# Patient Record
Sex: Female | Born: 1965
Health system: Southern US, Community
[De-identification: ages and names within clinical notes are randomized; demographics above are authoritative.]

## PROBLEM LIST (undated history)

## (undated) DIAGNOSIS — F419 Anxiety disorder, unspecified: Secondary | ICD-10-CM

## (undated) DIAGNOSIS — E785 Hyperlipidemia, unspecified: Secondary | ICD-10-CM

## (undated) DIAGNOSIS — G8929 Other chronic pain: Secondary | ICD-10-CM

## (undated) DIAGNOSIS — M069 Rheumatoid arthritis, unspecified: Secondary | ICD-10-CM

## (undated) DIAGNOSIS — R6884 Jaw pain: Secondary | ICD-10-CM

## (undated) DIAGNOSIS — F4310 Post-traumatic stress disorder, unspecified: Secondary | ICD-10-CM

## (undated) DIAGNOSIS — T7840XA Allergy, unspecified, initial encounter: Secondary | ICD-10-CM

## (undated) DIAGNOSIS — N3010 Interstitial cystitis (chronic) without hematuria: Secondary | ICD-10-CM

## (undated) DIAGNOSIS — Z5189 Encounter for other specified aftercare: Secondary | ICD-10-CM

## (undated) DIAGNOSIS — K589 Irritable bowel syndrome without diarrhea: Secondary | ICD-10-CM

## (undated) DIAGNOSIS — E079 Disorder of thyroid, unspecified: Secondary | ICD-10-CM

## (undated) DIAGNOSIS — F32A Depression, unspecified: Secondary | ICD-10-CM

## (undated) DIAGNOSIS — K219 Gastro-esophageal reflux disease without esophagitis: Secondary | ICD-10-CM

## (undated) DIAGNOSIS — M797 Fibromyalgia: Secondary | ICD-10-CM

## (undated) DIAGNOSIS — G4730 Sleep apnea, unspecified: Secondary | ICD-10-CM

## (undated) DIAGNOSIS — R87629 Unspecified abnormal cytological findings in specimens from vagina: Secondary | ICD-10-CM

## (undated) DIAGNOSIS — F329 Major depressive disorder, single episode, unspecified: Secondary | ICD-10-CM

## (undated) DIAGNOSIS — R32 Unspecified urinary incontinence: Secondary | ICD-10-CM

## (undated) HISTORY — DX: Gastro-esophageal reflux disease without esophagitis: K21.9

## (undated) HISTORY — DX: Post-traumatic stress disorder, unspecified: F43.10

## (undated) HISTORY — DX: Unspecified abnormal cytological findings in specimens from vagina: R87.629

## (undated) HISTORY — DX: Anxiety disorder, unspecified: F41.9

## (undated) HISTORY — PX: BLADDER SURGERY: SHX569

## (undated) HISTORY — DX: Major depressive disorder, single episode, unspecified: F32.9

## (undated) HISTORY — DX: Depression, unspecified: F32.A

## (undated) HISTORY — DX: Allergy, unspecified, initial encounter: T78.40XA

## (undated) HISTORY — DX: Other chronic pain: G89.29

## (undated) HISTORY — DX: Rheumatoid arthritis, unspecified: M06.9

## (undated) HISTORY — DX: Encounter for other specified aftercare: Z51.89

## (undated) HISTORY — DX: Disorder of thyroid, unspecified: E07.9

## (undated) HISTORY — DX: Jaw pain: R68.84

## (undated) HISTORY — DX: Interstitial cystitis (chronic) without hematuria: N30.10

## (undated) HISTORY — DX: Irritable bowel syndrome without diarrhea: K58.9

## (undated) HISTORY — DX: Hyperlipidemia, unspecified: E78.5

## (undated) HISTORY — DX: Fibromyalgia: M79.7

## (undated) HISTORY — PX: AUGMENTATION MAMMAPLASTY: SUR837

## (undated) HISTORY — DX: Unspecified urinary incontinence: R32

## (undated) HISTORY — PX: CYSTOSCOPY: SUR368

## (undated) HISTORY — PX: REDUCTION MAMMAPLASTY: SUR839

## (undated) HISTORY — PX: URETHRAL DILATION: SUR417

## (undated) HISTORY — PX: LEEP: SHX91

## (undated) HISTORY — DX: Sleep apnea, unspecified: G47.30

## (undated) HISTORY — PX: COSMETIC SURGERY: SHX468

---

## 2016-05-06 ENCOUNTER — Ambulatory Visit (INDEPENDENT_AMBULATORY_CARE_PROVIDER_SITE_OTHER): Payer: 59 | Admitting: Family Medicine

## 2016-05-06 ENCOUNTER — Encounter: Payer: Self-pay | Admitting: Family Medicine

## 2016-05-06 VITALS — BP 140/89 | HR 83 | Ht 63.0 in | Wt 156.0 lb

## 2016-05-06 DIAGNOSIS — M797 Fibromyalgia: Secondary | ICD-10-CM | POA: Diagnosis not present

## 2016-05-06 DIAGNOSIS — K589 Irritable bowel syndrome without diarrhea: Secondary | ICD-10-CM | POA: Diagnosis not present

## 2016-05-06 DIAGNOSIS — N3010 Interstitial cystitis (chronic) without hematuria: Secondary | ICD-10-CM | POA: Diagnosis not present

## 2016-05-06 DIAGNOSIS — K219 Gastro-esophageal reflux disease without esophagitis: Secondary | ICD-10-CM | POA: Insufficient documentation

## 2016-05-06 DIAGNOSIS — M2550 Pain in unspecified joint: Secondary | ICD-10-CM

## 2016-05-06 HISTORY — DX: Irritable bowel syndrome, unspecified: K58.9

## 2016-05-06 HISTORY — DX: Interstitial cystitis (chronic) without hematuria: N30.10

## 2016-05-06 NOTE — Patient Instructions (Signed)
Please contact me by the middle of next week if not contacted by a new pain management clinic here in [place].

## 2016-05-06 NOTE — Addendum Note (Signed)
Addended by: Wyline Beady on: 05/06/2016 04:08 PM   Modules accepted: Medications

## 2016-05-06 NOTE — Progress Notes (Signed)
CC: Debbie Hancock is a 50 y.o. female is here for Establish Care   Subjective: HPI:  Pleasant 50 year old here to establish care  She's considering moving here from [place]. She wants know if I can help arrange a referral to a pain management clinic. Her past medical history significant for a diagnosis of interstitial cystitis that apparently her urologist and gynecologist back in [place] tried their best to treat but she failed all known medical and surgical procedures to try to alleviate the pain from her pelvis. She tells me she experiences a constant burning sensation localized to the bladder in that she takes hydrocodone products. She is looking for pain management clinic that would help manage medications like this. She also has been experiencing diffuse arthritis mostly localized to the hands and to the hips. She's tried a variety of nonsteroidal anti-inflammatories but they've been ineffective. She denies any swelling redness or warmth of any of these joints. She also tells me she suffers from fibromyalgia that has been treated somewhat successfully with gabapentin.  Review of Systems - General ROS: negative for - chills, fever, night sweats, weight gain or weight loss Ophthalmic ROS: negative for - decreased vision Psychological ROS: negative for - anxiety  ENT ROS: negative for - hearing change, nasal congestion, tinnitus or allergies Hematological and Lymphatic ROS: negative for - bleeding problems, bruising or swollen lymph nodes Breast ROS: negative Respiratory ROS: no cough, shortness of breath, or wheezing Cardiovascular ROS: no chest pain or dyspnea on exertion Gastrointestinal ROS: no abdominal pain, change in bowel habits, or black or bloody stools Genito-Urinary ROS: negative for - genital discharge, genital ulcers, or abnormal bleeding from genitals. Positive for incontinence following urethral dilation Musculoskeletal ROS: negative for - joint pain or muscle pain  other than that described above Neurological ROS: negative for - headaches or memory loss Dermatological ROS: negative for lumps, mole changes, rash and skin lesion changes  Past Medical History  Diagnosis Date  . Interstitial cystitis 05/06/2016  . IBS (irritable bowel syndrome) 05/06/2016    No past surgical history on file. No family history on file.  Social History   Social History  . Marital Status: N/A    Spouse Name: N/A  . Number of Children: N/A  . Years of Education: N/A   Occupational History  . Not on file.   Social History Main Topics  . Smoking status: Never Smoker   . Smokeless tobacco: Not on file  . Alcohol Use: Not on file  . Drug Use: Not on file  . Sexual Activity: Not on file   Other Topics Concern  . Not on file   Social History Narrative  . No narrative on file     Objective: BP 140/89 mmHg  Pulse 83  Ht 5\' 3"  (1.6 m)  Wt 156 lb (70.761 kg)  BMI 27.64 kg/m2  Vital signs reviewed. General: Alert and Oriented, No Acute Distress HEENT: Pupils equal, round, reactive to light. Conjunctivae clear.  External ears unremarkable.  Moist mucous membranes. Lungs: Clear and comfortable work of breathing, speaking in full sentences without accessory muscle use. Cardiac: Regular rate and rhythm.  Neuro: CN II-XII grossly intact, gait normal. Extremities: No peripheral edema.  Strong peripheral pulses.  Mental Status: No depression, anxiety, nor agitation. Logical though process. Skin: Warm and dry.  Assessment & Plan: Debbie Hancock was seen today for establish care.  Diagnoses and all orders for this visit:  Interstitial cystitis -     Ambulatory referral to  Pain Clinic  Fibromyalgia  IBS (irritable bowel syndrome)  Multiple joint pain -     Ambulatory referral to Pain Clinic   Her primary goal today was a referral to pain management which seems reasonable. Joint decision to keep her appointment in July with her pre-existing pain management clinic  in [place] which she will cancel if she get a sooner appointment here in July.  Return if symptoms worsen or fail to improve.

## 2016-09-15 ENCOUNTER — Encounter: Payer: Self-pay | Admitting: Physician Assistant

## 2016-09-21 ENCOUNTER — Encounter: Payer: Self-pay | Admitting: Physician Assistant

## 2016-10-14 DIAGNOSIS — G8929 Other chronic pain: Secondary | ICD-10-CM | POA: Insufficient documentation

## 2016-10-14 DIAGNOSIS — M2550 Pain in unspecified joint: Secondary | ICD-10-CM | POA: Insufficient documentation

## 2018-07-15 ENCOUNTER — Encounter: Payer: Self-pay | Admitting: Physician Assistant

## 2018-07-15 ENCOUNTER — Ambulatory Visit (INDEPENDENT_AMBULATORY_CARE_PROVIDER_SITE_OTHER): Payer: BLUE CROSS/BLUE SHIELD | Admitting: Physician Assistant

## 2018-07-15 ENCOUNTER — Telehealth: Payer: Self-pay | Admitting: Physician Assistant

## 2018-07-15 VITALS — BP 101/65 | HR 86 | Ht 63.0 in | Wt 122.0 lb

## 2018-07-15 DIAGNOSIS — N3010 Interstitial cystitis (chronic) without hematuria: Secondary | ICD-10-CM

## 2018-07-15 DIAGNOSIS — F332 Major depressive disorder, recurrent severe without psychotic features: Secondary | ICD-10-CM

## 2018-07-15 DIAGNOSIS — F411 Generalized anxiety disorder: Secondary | ICD-10-CM

## 2018-07-15 DIAGNOSIS — F3181 Bipolar II disorder: Secondary | ICD-10-CM

## 2018-07-15 DIAGNOSIS — F430 Acute stress reaction: Secondary | ICD-10-CM | POA: Diagnosis not present

## 2018-07-15 DIAGNOSIS — R300 Dysuria: Secondary | ICD-10-CM

## 2018-07-15 DIAGNOSIS — Z6981 Encounter for mental health services for victim of other abuse: Secondary | ICD-10-CM

## 2018-07-15 DIAGNOSIS — M797 Fibromyalgia: Secondary | ICD-10-CM

## 2018-07-15 DIAGNOSIS — M329 Systemic lupus erythematosus, unspecified: Secondary | ICD-10-CM

## 2018-07-15 DIAGNOSIS — E782 Mixed hyperlipidemia: Secondary | ICD-10-CM

## 2018-07-15 DIAGNOSIS — G894 Chronic pain syndrome: Secondary | ICD-10-CM

## 2018-07-15 DIAGNOSIS — Z113 Encounter for screening for infections with a predominantly sexual mode of transmission: Secondary | ICD-10-CM | POA: Diagnosis not present

## 2018-07-15 DIAGNOSIS — E079 Disorder of thyroid, unspecified: Secondary | ICD-10-CM

## 2018-07-15 DIAGNOSIS — M069 Rheumatoid arthritis, unspecified: Secondary | ICD-10-CM

## 2018-07-15 LAB — POCT URINALYSIS DIPSTICK
BILIRUBIN UA: NEGATIVE
Glucose, UA: NEGATIVE
Ketones, UA: NEGATIVE
NITRITE UA: NEGATIVE
PH UA: 6.5 (ref 5.0–8.0)
PROTEIN UA: NEGATIVE
RBC UA: NEGATIVE
Spec Grav, UA: 1.015 (ref 1.010–1.025)
UROBILINOGEN UA: 0.2 U/dL

## 2018-07-15 MED ORDER — HYDROXYZINE HCL 50 MG PO TABS: 50.0000 mg | ORAL_TABLET | Freq: Three times a day (TID) | ORAL | 2 refills | Status: DC | PRN

## 2018-07-15 MED ORDER — HYDROCODONE-ACETAMINOPHEN 10-325 MG PO TABS: 1.0000 | ORAL_TABLET | Freq: Two times a day (BID) | ORAL | 0 refills | Status: DC | PRN

## 2018-07-15 MED ORDER — DULOXETINE HCL 30 MG PO CPEP: 30.0000 mg | ORAL_CAPSULE | Freq: Every day | ORAL | 2 refills | Status: DC

## 2018-07-15 MED ORDER — OXYBUTYNIN 3.9 MG/24HR TD PTTW: 1.0000 | MEDICATED_PATCH | TRANSDERMAL | 5 refills | Status: DC

## 2018-07-15 MED ORDER — LISINOPRIL 10 MG PO TABS: 10.0000 mg | ORAL_TABLET | Freq: Every day | ORAL | 3 refills | Status: DC
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
## 2018-07-15 NOTE — Telephone Encounter (Addendum)
Pt called. She wants her scripts called in to the [facility] on [address] She wants a call back to let her know when this is done. Pt called back, she said you forgot to add Lisinopril.

## 2018-07-15 NOTE — Telephone Encounter (Signed)
She needs a refill on Lisinopril 10 mg.

## 2018-07-15 NOTE — Progress Notes (Signed)
Needs culture

## 2018-07-15 NOTE — Telephone Encounter (Signed)
Done

## 2018-07-16 NOTE — Progress Notes (Signed)
Call pt: STD testing negative.

## 2018-07-18 NOTE — Telephone Encounter (Signed)
Left pt VM advising med was called in.

## 2018-07-20 ENCOUNTER — Other Ambulatory Visit: Payer: Self-pay

## 2018-07-20 ENCOUNTER — Other Ambulatory Visit: Payer: Self-pay | Admitting: Physician Assistant

## 2018-07-20 DIAGNOSIS — N3010 Interstitial cystitis (chronic) without hematuria: Secondary | ICD-10-CM

## 2018-07-20 DIAGNOSIS — R300 Dysuria: Secondary | ICD-10-CM

## 2018-07-20 LAB — PAIN MGMT, PROFILE 6 CONF W/O MM, U
6 ACETYLMORPHINE: NEGATIVE ng/mL (ref <10)
Alcohol Metabolites: NEGATIVE ng/mL (ref <500)
Alphahydroxyalprazolam: 158 ng/mL — ABNORMAL HIGH (ref <25)
Alphahydroxymidazolam: NEGATIVE ng/mL (ref <50)
Alphahydroxytriazolam: NEGATIVE ng/mL (ref <50)
Aminoclonazepam: NEGATIVE ng/mL (ref <25)
Amphetamines: NEGATIVE ng/mL (ref <500)
Barbiturates: NEGATIVE ng/mL (ref <300)
Benzodiazepines: POSITIVE ng/mL — AB (ref <100)
CREATININE: 35 mg/dL
Cocaine Metabolite: NEGATIVE ng/mL (ref <150)
Codeine: NEGATIVE ng/mL (ref <50)
HYDROCODONE: NEGATIVE ng/mL (ref <50)
HYDROXYETHYLFLURAZEPAM: NEGATIVE ng/mL (ref <50)
Hydromorphone: NEGATIVE ng/mL (ref <50)
Lorazepam: NEGATIVE ng/mL (ref <50)
METHADONE METABOLITE: NEGATIVE ng/mL (ref <100)
Marijuana Metabolite: NEGATIVE ng/mL (ref <20)
Morphine: 17875 ng/mL — ABNORMAL HIGH (ref <50)
Nordiazepam: NEGATIVE ng/mL (ref <50)
Norhydrocodone: NEGATIVE ng/mL (ref <50)
OXIDANT: NEGATIVE ug/mL (ref <200)
OXYCODONE: NEGATIVE ng/mL (ref <100)
Opiates: POSITIVE ng/mL — AB (ref <100)
Oxazepam: NEGATIVE ng/mL (ref <50)
PH: 6.65 (ref 4.5–9.0)
PHENCYCLIDINE: NEGATIVE ng/mL (ref <25)
TEMAZEPAM: NEGATIVE ng/mL (ref <50)

## 2018-07-20 LAB — C. TRACHOMATIS/N. GONORRHOEAE RNA
C. TRACHOMATIS RNA, TMA: NOT DETECTED
N. gonorrhoeae RNA, TMA: NOT DETECTED

## 2018-07-20 NOTE — Progress Notes (Signed)
Pt has IC and trying to determine if could have UTI or just IC flare. Send order for culture to lab if pt is still symptomatic so we can confirm no infection.

## 2018-07-21 LAB — URINE CULTURE
MICRO NUMBER:: 90936534
SPECIMEN QUALITY:: ADEQUATE

## 2018-07-22 DIAGNOSIS — F411 Generalized anxiety disorder: Secondary | ICD-10-CM | POA: Insufficient documentation

## 2018-07-22 DIAGNOSIS — F430 Acute stress reaction: Secondary | ICD-10-CM | POA: Insufficient documentation

## 2018-07-22 DIAGNOSIS — F3181 Bipolar II disorder: Secondary | ICD-10-CM | POA: Insufficient documentation

## 2018-07-22 DIAGNOSIS — M329 Systemic lupus erythematosus, unspecified: Secondary | ICD-10-CM | POA: Insufficient documentation

## 2018-07-22 DIAGNOSIS — E079 Disorder of thyroid, unspecified: Secondary | ICD-10-CM | POA: Insufficient documentation

## 2018-07-22 DIAGNOSIS — E785 Hyperlipidemia, unspecified: Secondary | ICD-10-CM | POA: Insufficient documentation

## 2018-07-22 DIAGNOSIS — F329 Major depressive disorder, single episode, unspecified: Secondary | ICD-10-CM | POA: Insufficient documentation

## 2018-07-22 DIAGNOSIS — G8929 Other chronic pain: Secondary | ICD-10-CM | POA: Insufficient documentation

## 2018-07-22 DIAGNOSIS — Z6981 Encounter for mental health services for victim of other abuse: Secondary | ICD-10-CM | POA: Insufficient documentation

## 2018-07-22 NOTE — Progress Notes (Signed)
Call pt: no active infection found on urine culture.

## 2018-07-25 ENCOUNTER — Encounter: Payer: Self-pay | Admitting: Physician Assistant

## 2018-07-25 DIAGNOSIS — M069 Rheumatoid arthritis, unspecified: Secondary | ICD-10-CM | POA: Insufficient documentation

## 2018-07-26 NOTE — Progress Notes (Signed)
Urologist- I was under the impression you were concerned that insurance would only be until the end of the month and you wanted to wait to get medicaid. Do you want me to make referral to urology.   I did not address any ENT issues so I have not concerned this referral.

## 2018-08-07 NOTE — Progress Notes (Signed)
Done

## 2018-08-07 NOTE — Addendum Note (Signed)
Addended by: Jomarie Longs on: 08/07/2018 10:14 PM   Modules accepted: Orders

## 2018-08-08 ENCOUNTER — Ambulatory Visit (INDEPENDENT_AMBULATORY_CARE_PROVIDER_SITE_OTHER): Payer: BLUE CROSS/BLUE SHIELD | Admitting: Obstetrics & Gynecology

## 2018-08-08 ENCOUNTER — Encounter: Payer: Self-pay | Admitting: Obstetrics & Gynecology

## 2018-08-08 VITALS — BP 83/57 | Ht 63.0 in | Wt 127.0 lb

## 2018-08-08 DIAGNOSIS — N870 Mild cervical dysplasia: Secondary | ICD-10-CM

## 2018-08-08 DIAGNOSIS — Z113 Encounter for screening for infections with a predominantly sexual mode of transmission: Secondary | ICD-10-CM | POA: Diagnosis not present

## 2018-08-08 DIAGNOSIS — R87629 Unspecified abnormal cytological findings in specimens from vagina: Secondary | ICD-10-CM | POA: Insufficient documentation

## 2018-08-08 DIAGNOSIS — Z23 Encounter for immunization: Secondary | ICD-10-CM | POA: Diagnosis not present

## 2018-08-08 DIAGNOSIS — Z0000 Encounter for general adult medical examination without abnormal findings: Secondary | ICD-10-CM
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
## 2018-08-09 LAB — HEPATITIS C ANTIBODY
Hepatitis C Ab: NONREACTIVE
SIGNAL TO CUT-OFF: 0.02 (ref <1.00)

## 2018-08-09 LAB — HIV ANTIBODY (ROUTINE TESTING W REFLEX): HIV 1&2 Ab, 4th Generation: NONREACTIVE

## 2018-08-09 LAB — HEPATITIS B SURFACE ANTIGEN: Hepatitis B Surface Ag: NONREACTIVE

## 2018-08-09 LAB — HSV 2 ANTIBODY, IGG: HSV 2 GLYCOPROTEIN G AB, IGG: 11.4 {index} — AB

## 2018-08-09 LAB — RPR: RPR Ser Ql: NONREACTIVE

## 2018-08-10 ENCOUNTER — Telehealth: Payer: Self-pay | Admitting: *Deleted

## 2018-08-10 LAB — CERVICOVAGINAL ANCILLARY ONLY
Chlamydia: NEGATIVE
NEISSERIA GONORRHEA: NEGATIVE

## 2018-08-10 NOTE — Telephone Encounter (Signed)
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 If she ever has an outbreak, she is welcome to have valtrex.

## 2018-08-10 NOTE — Telephone Encounter (Signed)
Attempted to call patient but only got her voicemail.  LM to call office for her test results.
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
## 2018-08-12 ENCOUNTER — Encounter: Payer: Self-pay | Admitting: Obstetrics

## 2018-08-12 ENCOUNTER — Inpatient Hospital Stay: Payer: BLUE CROSS/BLUE SHIELD | Attending: Obstetrics | Admitting: Obstetrics

## 2018-08-12 VITALS — BP 93/57 | HR 84 | Temp 98.6°F | Resp 20 | Ht 63.0 in | Wt 127.4 lb

## 2018-08-12 DIAGNOSIS — N870 Mild cervical dysplasia: Secondary | ICD-10-CM | POA: Insufficient documentation

## 2018-08-12 NOTE — Patient Instructions (Signed)
Please call our office at [phone] when you have reviewed the records and are aware of the date of your last pap with biopsy so we can arrange for an appointment in the office with Dr. Doroteo Glassman (6 mths from previous biopsy/pap) for another evaluation with colposcopy.

## 2018-08-16 ENCOUNTER — Other Ambulatory Visit: Payer: Self-pay

## 2018-08-16 NOTE — Telephone Encounter (Signed)
Patient request a refill on Hydrocodone. °

## 2018-08-17 MED ORDER — HYDROCODONE-ACETAMINOPHEN 10-325 MG PO TABS: 1.0000 | ORAL_TABLET | Freq: Two times a day (BID) | ORAL | 0 refills | Status: DC | PRN

## 2018-08-19 ENCOUNTER — Encounter: Payer: Self-pay | Admitting: Physician Assistant

## 2018-08-19 ENCOUNTER — Ambulatory Visit (INDEPENDENT_AMBULATORY_CARE_PROVIDER_SITE_OTHER): Payer: BLUE CROSS/BLUE SHIELD | Admitting: Physician Assistant

## 2018-08-19 VITALS — BP 108/72 | HR 82 | Ht 63.0 in | Wt 126.0 lb

## 2018-08-19 DIAGNOSIS — Z23 Encounter for immunization: Secondary | ICD-10-CM

## 2018-08-19 DIAGNOSIS — M329 Systemic lupus erythematosus, unspecified: Secondary | ICD-10-CM | POA: Diagnosis not present

## 2018-08-19 DIAGNOSIS — M5030 Other cervical disc degeneration, unspecified cervical region: Secondary | ICD-10-CM | POA: Insufficient documentation

## 2018-08-19 DIAGNOSIS — F419 Anxiety disorder, unspecified: Secondary | ICD-10-CM | POA: Diagnosis not present

## 2018-08-19 DIAGNOSIS — G894 Chronic pain syndrome: Secondary | ICD-10-CM

## 2018-08-19 DIAGNOSIS — F332 Major depressive disorder, recurrent severe without psychotic features: Secondary | ICD-10-CM | POA: Diagnosis not present

## 2018-08-19 DIAGNOSIS — M5136 Other intervertebral disc degeneration, lumbar region: Secondary | ICD-10-CM

## 2018-08-19 NOTE — Patient Instructions (Signed)
Will make referral for ortho.  Work on decreasing xanax to 1/2 tablet as needed for anxiety.
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
## 2018-08-21 ENCOUNTER — Encounter: Payer: Self-pay | Admitting: Physician Assistant

## 2018-08-24 ENCOUNTER — Telehealth: Payer: Self-pay | Admitting: *Deleted

## 2018-08-24 NOTE — Telephone Encounter (Signed)
Patient returned a call to our office, patient is unaware when she had her last colposcopy or biopsy.  Patient thinks it was in 2010 or 2011 for the colposcopy and around May of 2019 for the biopsy.  Patient states that she has been feeling really nauseous, fatigue, and is having pelvic and back pain.  I spoke with Warner Mccreedy, NP and she is going to speak with Dr. Doroteo Glassman in the morning about this patient.  I told patient that our office would give her a call back tomorrow once Dr. Doroteo Glassman is made aware.  I told patient that we may need to get her in to see Dr. Doroteo Glassman on Monday, August 29, 2018.  Patient verbalized understanding.  I also ask patient to bring a copy of her records from [place] the next time we see her in the office.  Patient verbalized understanding.

## 2018-08-25 ENCOUNTER — Telehealth: Payer: Self-pay

## 2018-08-25 NOTE — Telephone Encounter (Signed)
Per Warner Mccreedy NP and Dr Doroteo Glassman, make office appt here for patient for this Monday.  I let patient know that Dr Doroteo Glassman was made aware of her symptoms and would like to see her for appt on Monday.  Patient verbalized she has been nauseated and fatigued and reports it may be related to her Lupus but would like to see Dr Doroteo Glassman for appt on Monday, 08-29-2018 at 2:45 pm, arrive few min early for registration. No other needs per pt at this time. Reminded her per Efraim Kaufmann NP to bring her records. Pt voiced understanding.
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
## 2018-08-29 ENCOUNTER — Inpatient Hospital Stay: Payer: BLUE CROSS/BLUE SHIELD | Attending: Obstetrics | Admitting: Obstetrics

## 2018-08-29 ENCOUNTER — Other Ambulatory Visit (HOSPITAL_COMMUNITY)
Admission: RE | Admit: 2018-08-29 | Discharge: 2018-08-29 | Disposition: A | Discharge status: Home / Self Care | Payer: BLUE CROSS/BLUE SHIELD | Source: Ambulatory Visit | Attending: Obstetrics | Admitting: Obstetrics

## 2018-08-29 ENCOUNTER — Encounter: Payer: Self-pay | Admitting: Obstetrics

## 2018-08-29 VITALS — BP 111/73 | HR 77 | Temp 98.5°F | Resp 20 | Ht 63.0 in | Wt 129.0 lb

## 2018-08-29 DIAGNOSIS — N870 Mild cervical dysplasia: Secondary | ICD-10-CM | POA: Diagnosis not present

## 2018-08-29 DIAGNOSIS — Z124 Encounter for screening for malignant neoplasm of cervix: Secondary | ICD-10-CM | POA: Insufficient documentation

## 2018-08-29 NOTE — Patient Instructions (Signed)
1. Return for repeat Pap and ECC in 6 months 2. Followup for bloating issues with Dr. Marice Potter or your PCP 3. If the Pap from today is abnormal we may call you back for a colposcopy

## 2018-08-31 ENCOUNTER — Encounter: Payer: Self-pay | Admitting: Physician Assistant

## 2018-08-31 ENCOUNTER — Ambulatory Visit (INDEPENDENT_AMBULATORY_CARE_PROVIDER_SITE_OTHER): Payer: BLUE CROSS/BLUE SHIELD | Admitting: Physician Assistant

## 2018-08-31 ENCOUNTER — Ambulatory Visit (INDEPENDENT_AMBULATORY_CARE_PROVIDER_SITE_OTHER): Payer: BLUE CROSS/BLUE SHIELD

## 2018-08-31 VITALS — BP 97/73 | HR 87 | Ht 63.0 in | Wt 127.0 lb

## 2018-08-31 DIAGNOSIS — M795 Residual foreign body in soft tissue: Secondary | ICD-10-CM

## 2018-08-31 DIAGNOSIS — M25571 Pain in right ankle and joints of right foot: Secondary | ICD-10-CM

## 2018-08-31 DIAGNOSIS — T148XXA Other injury of unspecified body region, initial encounter: Secondary | ICD-10-CM | POA: Diagnosis not present

## 2018-08-31 DIAGNOSIS — S91341A Puncture wound with foreign body, right foot, initial encounter: Secondary | ICD-10-CM | POA: Insufficient documentation

## 2018-08-31 DIAGNOSIS — S8011XA Contusion of right lower leg, initial encounter: Secondary | ICD-10-CM

## 2018-08-31 DIAGNOSIS — S99911A Unspecified injury of right ankle, initial encounter: Secondary | ICD-10-CM

## 2018-08-31 LAB — CYTOLOGY - PAP
DIAGNOSIS: NEGATIVE
HPV (WINDOPATH): NOT DETECTED

## 2018-08-31 NOTE — Progress Notes (Signed)
Call pt: no fracture or dislocation of ankle. There is a tiny metallic foreign body in the heel of right foot. If not bothering you I would not do anything about it but if having right heel pain could consider removal.

## 2018-08-31 NOTE — Patient Instructions (Addendum)
Cut lisionpril in half and keep check BP.   Tension Headache A tension headache is pain, pressure, or aching that is felt over the front and sides of your head. These headaches can last from 30 minutes to several days. Follow these instructions at home: Managing pain  Take over-the-counter and prescription medicines only as told by your doctor.  Lie down in a dark, quiet room when you have a headache.  If directed, apply ice to your head and neck area: ? Put ice in a plastic bag. ? Place a towel between your skin and the bag. ? Leave the ice on for 20 minutes, 2-3 times per day.  Use a heating pad or a hot shower to apply heat to your head and neck area as told by your doctor. Eating and drinking  Eat meals on a regular schedule.  Do not drink a lot of alcohol.  Do not use a lot of caffeine, or stop using caffeine. General instructions  Keep all follow-up visits as told by your doctor. This is important.  Keep a journal to find out if certain things bring on headaches. For example, write down: ? What you eat and drink. ? How much sleep you get. ? Any change to your diet or medicines.  Try getting a massage, or doing other things that help you to relax.  Lessen stress.  Sit up straight. Do not tighten (tense) your muscles.  Do not use tobacco products. This includes cigarettes, chewing tobacco, or e-cigarettes. If you need help quitting, ask your doctor.  Exercise regularly as told by your doctor.  Get enough sleep. This may mean 7-9 hours of sleep. Contact a doctor if:  Your symptoms are not helped by medicine.  You have a headache that feels different from your usual headache.  You feel sick to your stomach (nauseous) or you throw up (vomit).  You have a fever. Get help right away if:  Your headache becomes very bad.  You keep throwing up.  You have a stiff neck.  You have trouble seeing.  You have trouble speaking.  You have pain in your eye or  ear.  Your muscles are weak or you lose muscle control.  You lose your balance or you have trouble walking.  You feel like you will pass out (faint) or you pass out.  You have confusion. This information is not intended to replace advice given to you by your health care provider. Make sure you discuss any questions you have with your health care provider. Document Released: 02/24/2010 Document Revised: 07/30/2016 Document Reviewed: 03/25/2015 Elsevier Interactive Patient Education  2018 Else[employer] Inc[employer]   Contusion A contusion is a deep bruise. Contusions happen when an injury causes bleeding under the skin. Symptoms of bruising include pain, swelling, and discolored skin. The skin may turn blue, purple, or yellow. Follow these instructions at home:  Rest the injured area.  If told, put ice on the injured area. ? Put ice in a plastic bag. ? Place a towel between your skin and the bag. ? Leave the ice on for 20 minutes, 2-3 times per day.  If told, put light pressure (compression) on the injured area using an elastic bandage. Make sure the bandage is not too tight. Remove it and put it back on as told by your doctor.  If possible, raise (elevate) the injured area above the level of your heart while you are sitting or lying down.  Take over-the-counter and prescription medicines only as  told by your doctor. Contact a doctor if:  Your symptoms do not get better after several days of treatment.  Your symptoms get worse.  You have trouble moving the injured area. Get help right away if:  You have very bad pain.  You have a loss of feeling (numbness) in a hand or foot.  Your hand or foot turns pale or cold. This information is not intended to replace advice given to you by your health care provider. Make sure you discuss any questions you have with your health care provider. Document Released: 05/18/2008 Document Revised: 05/07/2016 Document Reviewed: 04/17/2015 Elsevier  Interactive Patient Education  2018 [employer].
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
## 2018-09-01 ENCOUNTER — Telehealth: Payer: Self-pay

## 2018-09-01 NOTE — Telephone Encounter (Signed)
Incoming call from patient regarding call she received yesterday.  Per Warner Mccreedy NP- "pap smear is normal, High risk HPV is negative, and scraping of lining of cervix was normal- f/u in 6 months for appt here."  Pt voiced understanding, told her to call back in January 2020 for appt in March 2020 Dr Doroteo Glassman.  No other needs per pt at this time.

## 2018-09-07 ENCOUNTER — Ambulatory Visit (INDEPENDENT_AMBULATORY_CARE_PROVIDER_SITE_OTHER): Payer: BLUE CROSS/BLUE SHIELD | Admitting: Physician Assistant

## 2018-09-07 ENCOUNTER — Encounter: Payer: Self-pay | Admitting: Physician Assistant

## 2018-09-07 VITALS — BP 107/76 | HR 85 | Ht 63.0 in | Wt 130.0 lb

## 2018-09-07 DIAGNOSIS — T148XXA Other injury of unspecified body region, initial encounter: Secondary | ICD-10-CM | POA: Diagnosis not present

## 2018-09-07 DIAGNOSIS — H1013 Acute atopic conjunctivitis, bilateral: Secondary | ICD-10-CM | POA: Diagnosis not present

## 2018-09-07 DIAGNOSIS — J309 Allergic rhinitis, unspecified: Secondary | ICD-10-CM

## 2018-09-07 DIAGNOSIS — S7011XD Contusion of right thigh, subsequent encounter: Secondary | ICD-10-CM

## 2018-09-07 MED ORDER — METHYLPREDNISOLONE SODIUM SUCC 125 MG IJ SOLR
125.0000 mg | Freq: Once | INTRAMUSCULAR | Status: AC
  Administered 2018-09-07: 125 mg via INTRAMUSCULAR

## 2018-09-07 NOTE — Patient Instructions (Signed)
Allergies, Adult An allergy is when your body's defense system (immune system) overreacts to an otherwise harmless substance (allergen) that you breathe in or eat or something that touches your skin. When you come into contact with something that you are allergic to, your immune system produces certain proteins (antibodies). These proteins cause cells to release chemicals (histamines) that trigger the symptoms of an allergic reaction. Allergies often affect the nasal passages (allergic rhinitis), eyes (allergic conjunctivitis), skin (atopic dermatitis), and stomach. Allergies can be mild or severe. Allergies cannot spread from person to person (are not contagious). They can develop at any age and may be outgrown. What increases the risk? You may be at greater risk of allergies if other people in your family have allergies. What are the signs or symptoms? Symptoms depend on what type of allergy you have. They may include:  Runny, stuffy nose.  Sneezing.  Itchy mouth, ears, or throat.  Postnasal drip.  Sore throat.  Itchy, red, watery, or puffy eyes.  Skin rash or hives.  Stomach pain.  Vomiting.  Diarrhea.  Bloating.  Wheezing or coughing.  People with a severe allergy to food, medicine, or an insect bite may have a life-threatening allergic reaction (anaphylaxis). Symptoms of anaphylaxis include:  Hives.  Itching.  Flushed face.  Swollen lips, tongue, or mouth.  Tight or swollen throat.  Chest pain or tightness in the chest.  Trouble breathing or shortness of breath.  Rapid heartbeat.  Dizziness or fainting.  Vomiting.  Diarrhea.  Pain in the abdomen.  How is this diagnosed? This condition is diagnosed based on:  Your symptoms.  Your family and medical history.  A physical exam.  You may need to see a health care provider who specializes in treating allergies (allergist). You may also have tests, including:  Skin tests to see which allergens are  causing your symptoms, such as: ? Skin prick test. In this test, your skin is pricked with a tiny needle and exposed to small amounts of possible allergens to see if your skin reacts. ? Intradermal skin test. In this test, a small amount of allergen is injected under your skin to see if your skin reacts. ? Patch test. In this test, a small amount of allergen is placed on your skin and then your skin is covered with a bandage. Your health care provider will check your skin after a couple of days to see if a rash has developed.  Blood tests.  Challenges tests. In this test, you inhale a small amount of allergen by mouth to see if you have an allergic reaction.  You may also be asked to:  Keep a food diary. A food diary is a record of all the foods and drinks you have in a day and any symptoms you experience.  Practice an elimination diet. An elimination diet involves eliminating specific foods from your diet and then adding them back in one by one to find out if a certain food causes an allergic reaction.  How is this treated? Treatment for allergies depends on your symptoms. Treatment may include:  Cold compresses to soothe itching and swelling.  Eye drops.  Nasal sprays.  Using a saline spray or container (neti pot) to flush out the nose (nasal irrigation). These methods can help clear away mucus and keep the nasal passages moist.  Using a humidifier.  Oral antihistamines or other medicines to block allergic reaction and inflammation.  Skin creams to treat rashes or itching.  Diet changes to   eliminate food allergy triggers.  Repeated exposure to tiny amounts of allergens to build up a tolerance and prevent future allergic reactions (immunotherapy). These include: ? Allergy shots. ? Oral treatment. This involves taking small doses of an allergen under the tongue (sublingual immunotherapy).  Emergency epinephrine injection (auto-injector) in case of an allergic emergency. This is  a self-injectable, pre-measured medicine that must be given within the first few minutes of a serious allergic reaction.  Follow these instructions at home:  Avoid known allergens whenever possible.  If you suffer from airborne allergens, wash out your nose daily. You can do this with a saline spray or a neti pot to flush out your nose (nasal irrigation).  Take over-the-counter and prescription medicines only as told by your health care provider.  Keep all follow-up visits as told by your health care provider. This is important.  If you are at risk of a severe allergic reaction (anaphylaxis), keep your auto-injector with you at all times.  If you have ever had anaphylaxis, wear a medical alert bracelet or necklace that states you have a severe allergy. Contact a health care provider if:  Your symptoms do not improve with treatment. Get help right away if:  You have symptoms of anaphylaxis, such as: ? Swollen mouth, tongue, or throat. ? Pain or tightness in your chest. ? Trouble breathing or shortness of breath. ? Dizziness or fainting. ? Severe abdominal pain, vomiting, or diarrhea. This information is not intended to replace advice given to you by your health care provider. Make sure you discuss any questions you have with your health care provider. Document Released: 02/23/2003 Document Revised: 03/31/2017 Document Reviewed: 06/17/2016 Elsevier Interactive Patient Education  2018 [employer]. Allergic Conjunctivitis A clear membrane (conjunctiva) covers the white part of your eye and the inner surface of your eyelid. Allergic conjunctivitis happens when this membrane has inflammation. This is caused by allergies. Common causes of allergic reactions (allergens)include:  Outdoor allergens, such as: ? Pollen. ? Grass and weeds. ? Mold spores.  Indoor allergens, such as: ? Dust. ? Smoke. ? Mold. ? Pet dander. ? Animal hair.  This condition can make your eye red or pink. It  can also make your eye feel itchy. This condition cannot be spread from one person to another person (is not contagious). Follow these instructions at home:  Try not to be around things that you are allergic to.  Take or apply over-the-counter and prescription medicines only as told by your doctor. These include any eye drops.  Place a cool, clean washcloth on your eye for 10-20 minutes. Do this 3-4 times a day.  Do not touch or rub your eyes.  Do not wear contact lenses until the inflammation is gone. Wear glasses instead.  Do not wear eye makeup until the inflammation is gone.  Keep all follow-up visits as told by your doctor. This is important. Contact a doctor if:  Your symptoms get worse.  Your symptoms do not get better with treatment.  You have mild eye pain.  You are sensitive to light,  You have spots or blisters on your eyes.  You have pus coming from your eye.  You have a fever. Get help right away if:  You have redness, swelling, or other symptoms in only one eye.  Your vision is blurry.  You have vision changes.  You have very bad eye pain. Summary  Allergic conjunctivitis is caused by allergies. It can make your eye red or pink, and it  can make your eye feel itchy.  This condition cannot be spread from one person to another person (is not contagious).  Try not to be around things that you are allergic to.  Take or apply over-the-counter and prescription medicines only as told by your doctor. These include any eye drops.  Contact your doctor if your symptoms get worse or they do not get better with treatment. This information is not intended to replace advice given to you by your health care provider. Make sure you discuss any questions you have with your health care provider. Document Released: 05/20/2010 Document Revised: 07/24/2016 Document Reviewed: 07/24/2016 Elsevier Interactive Patient Education  2017 [employer].

## 2018-09-07 NOTE — Progress Notes (Deleted)
   Allergies-- usually get allergies. Dry eyes, hurt. Pressure in head. Congested in sinuses. Allergy pills. Decongestant. Flonase-- using every day. Took ibuprofen. Ear pain. Tmj. No fever. Headaches- pretty common. Pressure.   Thinks blood clot. Noticed bump last week. Tender to touch. FM, RA. Feels like throbbing. Feels like it is getting worse.
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
## 2018-09-15 ENCOUNTER — Other Ambulatory Visit: Payer: Self-pay

## 2018-09-15 MED ORDER — HYDROCODONE-ACETAMINOPHEN 10-325 MG PO TABS: 1.0000 | ORAL_TABLET | Freq: Two times a day (BID) | ORAL | 0 refills | Status: DC | PRN

## 2018-09-15 NOTE — Telephone Encounter (Signed)
Debbie Hancock called this morning and said she is going to need a refill on her pain medication (Hydrocodone). Just wanted to let you know! Thanks!

## 2018-09-23 ENCOUNTER — Ambulatory Visit: Payer: BLUE CROSS/BLUE SHIELD | Admitting: Physician Assistant

## 2018-10-04 ENCOUNTER — Telehealth: Payer: Self-pay

## 2018-10-04 NOTE — Telephone Encounter (Signed)
Pt called with complains of swelling front the waist up. Pt reports swelling causing difficulties breathing and also difficulty swallowing. Reports she has had a lot of "muscle and bone aches". Pt states "I should have gone to the ER this weekend when all this started, but I didn't".   I advised pt she needs someone to take her to ER as soon as possible, or call ambulance.  Pt states that her sister is available to take her to ER right now, I advised her to go be evaluated.   FYI to PCP

## 2018-10-04 NOTE — Telephone Encounter (Signed)
Agree with plan 

## 2018-10-10 ENCOUNTER — Ambulatory Visit: Payer: BLUE CROSS/BLUE SHIELD | Admitting: Physician Assistant

## 2018-10-17 ENCOUNTER — Encounter: Payer: Self-pay | Admitting: Physician Assistant

## 2018-10-17 ENCOUNTER — Other Ambulatory Visit: Payer: Self-pay | Admitting: Physician Assistant

## 2018-10-17 ENCOUNTER — Telehealth: Payer: Self-pay

## 2018-10-17 ENCOUNTER — Ambulatory Visit (INDEPENDENT_AMBULATORY_CARE_PROVIDER_SITE_OTHER): Payer: BLUE CROSS/BLUE SHIELD | Admitting: Physician Assistant

## 2018-10-17 VITALS — BP 135/77 | HR 101 | Ht 63.0 in | Wt 136.0 lb

## 2018-10-17 DIAGNOSIS — R631 Polydipsia: Secondary | ICD-10-CM

## 2018-10-17 DIAGNOSIS — N870 Mild cervical dysplasia: Secondary | ICD-10-CM

## 2018-10-17 DIAGNOSIS — E782 Mixed hyperlipidemia: Secondary | ICD-10-CM

## 2018-10-17 DIAGNOSIS — M2550 Pain in unspecified joint: Secondary | ICD-10-CM

## 2018-10-17 DIAGNOSIS — Z13220 Encounter for screening for lipoid disorders: Secondary | ICD-10-CM

## 2018-10-17 DIAGNOSIS — R7989 Other specified abnormal findings of blood chemistry: Secondary | ICD-10-CM

## 2018-10-17 DIAGNOSIS — M5030 Other cervical disc degeneration, unspecified cervical region: Secondary | ICD-10-CM

## 2018-10-17 DIAGNOSIS — R220 Localized swelling, mass and lump, head: Secondary | ICD-10-CM

## 2018-10-17 DIAGNOSIS — R768 Other specified abnormal immunological findings in serum: Secondary | ICD-10-CM

## 2018-10-17 DIAGNOSIS — F3181 Bipolar II disorder: Secondary | ICD-10-CM

## 2018-10-17 DIAGNOSIS — H539 Unspecified visual disturbance: Secondary | ICD-10-CM

## 2018-10-17 DIAGNOSIS — Z0289 Encounter for other administrative examinations: Secondary | ICD-10-CM

## 2018-10-17 DIAGNOSIS — R350 Frequency of micturition: Secondary | ICD-10-CM

## 2018-10-17 DIAGNOSIS — G894 Chronic pain syndrome: Secondary | ICD-10-CM

## 2018-10-17 MED ORDER — HYDROCODONE-ACETAMINOPHEN 10-325 MG PO TABS: 1.0000 | ORAL_TABLET | Freq: Two times a day (BID) | ORAL | 0 refills | Status: DC | PRN
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
## 2018-10-17 NOTE — Patient Instructions (Signed)
Get labs.  Will make referral for rheumatology.  Follow up in 3 months.

## 2018-10-17 NOTE — Telephone Encounter (Signed)
Tiffany J. From [facility] contacted our office to let pt know she needs to come back in for re-draw of A1C since the sample they collected today clotted.   I have called and left pt a msg advising her to go back to lab for re-draw and let them know she should not be charged for this.   FYI to PCP

## 2018-10-17 NOTE — Telephone Encounter (Signed)
Ok thanks for letting me know

## 2018-10-18 ENCOUNTER — Encounter: Payer: Self-pay | Admitting: Physician Assistant

## 2018-10-18 DIAGNOSIS — H47393 Other disorders of optic disc, bilateral: Secondary | ICD-10-CM | POA: Insufficient documentation

## 2018-10-18 DIAGNOSIS — H3561 Retinal hemorrhage, right eye: Secondary | ICD-10-CM | POA: Insufficient documentation

## 2018-10-18 LAB — CYCLIC CITRUL PEPTIDE ANTIBODY, IGG: CYCLIC CITRULLIN PEPTIDE AB: 43 U — AB

## 2018-10-18 LAB — RHEUMATOID FACTOR

## 2018-10-18 NOTE — Progress Notes (Signed)
Make sure she had the other drawn.  We need the a1c.   Cholesterol is elevated I would consider starting a statin for CV risk reduction. Are you ok with this?   Mildly positive anti ccp will send to rheumatologist.

## 2018-10-19 LAB — PAIN MGMT, PROFILE 7 CONF W/O MM, U
6 ACETYLMORPHINE: NEGATIVE ng/mL (ref <10)
Alcohol Metabolites: NEGATIVE ng/mL (ref <500)
Amphetamines: NEGATIVE ng/mL (ref <500)
BENZODIAZEPINES: NEGATIVE ng/mL (ref <100)
Barbiturates: NEGATIVE ng/mL (ref <300)
CODEINE: NEGATIVE ng/mL (ref <50)
Cocaine Metabolite: NEGATIVE ng/mL (ref <150)
Creatinine: 101.3 mg/dL
HYDROCODONE: 486 ng/mL — AB (ref <50)
Hydromorphone: 82 ng/mL — ABNORMAL HIGH (ref <50)
Marijuana Metabolite: NEGATIVE ng/mL (ref <20)
Methadone Metabolite: NEGATIVE ng/mL (ref <100)
Morphine: NEGATIVE ng/mL (ref <50)
Norhydrocodone: 1026 ng/mL — ABNORMAL HIGH (ref <50)
OXIDANT: NEGATIVE ug/mL (ref <200)
Opiates: POSITIVE ng/mL — AB (ref <100)
Oxycodone: NEGATIVE ng/mL (ref <100)
PH: 6.44 (ref 4.5–9.0)

## 2018-10-19 LAB — HEMOGLOBIN A1C
Hgb A1c MFr Bld: 5 % of total Hgb (ref <5.7)
Mean Plasma Glucose: 97 (calc)
eAG (mmol/L): 5.4 (calc)

## 2018-10-19 LAB — CORTISOL: Cortisol, Plasma: 6.2 ug/dL

## 2018-10-19 NOTE — Progress Notes (Signed)
Call pt: great news you are NOT a diabetic. A!C in normal range.

## 2018-10-19 NOTE — Progress Notes (Signed)
ANA negative

## 2018-10-20 MED ORDER — ATORVASTATIN CALCIUM 20 MG PO TABS: 20.0000 mg | ORAL_TABLET | Freq: Every day | ORAL | 3 refills | Status: DC

## 2018-10-20 NOTE — Progress Notes (Signed)
Call pt: as suspected.

## 2018-10-20 NOTE — Progress Notes (Signed)
lipitor sent 

## 2018-10-21 DIAGNOSIS — N870 Mild cervical dysplasia: Secondary | ICD-10-CM | POA: Insufficient documentation

## 2018-10-24 LAB — LIPID PANEL W/REFLEX DIRECT LDL
CHOL/HDL RATIO: 4 (calc) (ref <5.0)
Cholesterol: 250 mg/dL — ABNORMAL HIGH (ref <200)
HDL: 63 mg/dL (ref >50)
LDL CHOLESTEROL (CALC): 145 mg/dL — AB
Non-HDL Cholesterol (Calc): 187 mg/dL (calc) — ABNORMAL HIGH (ref <130)
Triglycerides: 294 mg/dL — ABNORMAL HIGH (ref <150)

## 2018-10-24 LAB — HEMOGLOBIN A1C

## 2018-10-24 LAB — ANA: Anti Nuclear Antibody(ANA): NEGATIVE
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
## 2018-11-14 ENCOUNTER — Telehealth: Payer: Self-pay

## 2018-11-14 DIAGNOSIS — M2550 Pain in unspecified joint: Secondary | ICD-10-CM

## 2018-11-14 DIAGNOSIS — G894 Chronic pain syndrome: Secondary | ICD-10-CM

## 2018-11-14 DIAGNOSIS — M5030 Other cervical disc degeneration, unspecified cervical region: Secondary | ICD-10-CM

## 2018-11-14 DIAGNOSIS — Z0289 Encounter for other administrative examinations: Secondary | ICD-10-CM

## 2018-11-14 MED ORDER — HYDROCODONE-ACETAMINOPHEN 10-325 MG PO TABS: 1.0000 | ORAL_TABLET | Freq: Two times a day (BID) | ORAL | 0 refills | Status: DC | PRN

## 2018-11-14 NOTE — Telephone Encounter (Signed)
Debbie Hancock called today asking if we could refill her pain medication for her? I saw that the last prescription was sent on 10/17/2018 60 tablets with no refills. Just wanted to ask you first. Thanks!

## 2018-11-14 NOTE — Telephone Encounter (Signed)
Sent post dated for the 4th.

## 2018-11-14 NOTE — Telephone Encounter (Signed)
Patient has been notified of medication refill.

## 2018-11-18 ENCOUNTER — Ambulatory Visit (INDEPENDENT_AMBULATORY_CARE_PROVIDER_SITE_OTHER): Payer: BLUE CROSS/BLUE SHIELD | Admitting: Rheumatology

## 2018-11-18 ENCOUNTER — Encounter: Payer: Self-pay | Admitting: Rheumatology

## 2018-11-18 VITALS — BP 123/80 | HR 102 | Resp 12 | Ht 63.0 in | Wt 143.0 lb

## 2018-11-18 DIAGNOSIS — F329 Major depressive disorder, single episode, unspecified: Secondary | ICD-10-CM

## 2018-11-18 DIAGNOSIS — F419 Anxiety disorder, unspecified: Secondary | ICD-10-CM

## 2018-11-18 DIAGNOSIS — H3561 Retinal hemorrhage, right eye: Secondary | ICD-10-CM

## 2018-11-18 DIAGNOSIS — Z8719 Personal history of other diseases of the digestive system: Secondary | ICD-10-CM

## 2018-11-18 DIAGNOSIS — M797 Fibromyalgia: Secondary | ICD-10-CM | POA: Diagnosis not present

## 2018-11-18 DIAGNOSIS — M51369 Other intervertebral disc degeneration, lumbar region without mention of lumbar back pain or lower extremity pain: Secondary | ICD-10-CM

## 2018-11-18 DIAGNOSIS — R7681 Abnormal rheumatoid factor and anti-citrullinated protein antibody without rheumatoid arthritis: Secondary | ICD-10-CM

## 2018-11-18 DIAGNOSIS — R7989 Other specified abnormal findings of blood chemistry: Secondary | ICD-10-CM

## 2018-11-18 DIAGNOSIS — R768 Other specified abnormal immunological findings in serum: Secondary | ICD-10-CM

## 2018-11-18 DIAGNOSIS — M5136 Other intervertebral disc degeneration, lumbar region: Secondary | ICD-10-CM

## 2018-11-18 DIAGNOSIS — N870 Mild cervical dysplasia: Secondary | ICD-10-CM

## 2018-11-18 DIAGNOSIS — F32A Depression, unspecified: Secondary | ICD-10-CM

## 2018-11-18 DIAGNOSIS — N3010 Interstitial cystitis (chronic) without hematuria: Secondary | ICD-10-CM

## 2018-11-18 DIAGNOSIS — M5030 Other cervical disc degeneration, unspecified cervical region: Secondary | ICD-10-CM | POA: Diagnosis not present

## 2018-11-18 DIAGNOSIS — M2550 Pain in unspecified joint: Secondary | ICD-10-CM | POA: Diagnosis not present

## 2018-11-18 DIAGNOSIS — Z8659 Personal history of other mental and behavioral disorders: Secondary | ICD-10-CM

## 2018-11-18 DIAGNOSIS — Z8639 Personal history of other endocrine, nutritional and metabolic disease: Secondary | ICD-10-CM

## 2018-11-24 ENCOUNTER — Telehealth: Payer: Self-pay

## 2018-11-24 DIAGNOSIS — F3181 Bipolar II disorder: Secondary | ICD-10-CM

## 2018-11-24 NOTE — Telephone Encounter (Signed)
Lynsay called today requesting a referral to see a psychiatrist at [facility]. I wanted to ask you first to make sure the referral was okay to order. I will go ahead and order and pend the request for you. Thanks so much!

## 2018-11-26 NOTE — Telephone Encounter (Signed)
Done

## 2018-12-13 ENCOUNTER — Telehealth: Payer: Self-pay

## 2018-12-13 DIAGNOSIS — M5030 Other cervical disc degeneration, unspecified cervical region: Secondary | ICD-10-CM

## 2018-12-13 DIAGNOSIS — M2550 Pain in unspecified joint: Secondary | ICD-10-CM

## 2018-12-13 DIAGNOSIS — G894 Chronic pain syndrome: Secondary | ICD-10-CM

## 2018-12-13 DIAGNOSIS — Z0289 Encounter for other administrative examinations: Secondary | ICD-10-CM

## 2018-12-13 MED ORDER — HYDROCODONE-ACETAMINOPHEN 10-325 MG PO TABS: 1.0000 | ORAL_TABLET | Freq: Two times a day (BID) | ORAL | 0 refills | Status: DC | PRN

## 2018-12-13 NOTE — Telephone Encounter (Signed)
Called and left a message for patient notifying her of medication refill. Office contact information provided if any further questions or concerns.

## 2018-12-13 NOTE — Telephone Encounter (Signed)
Refilled post dated to 12/15/17

## 2018-12-13 NOTE — Telephone Encounter (Signed)
Patient called and is requesting refill on Norco. Last refill date was 11/16/2018. Patient is not yet due for a follow-up appointment. Thanks!

## 2018-12-15 ENCOUNTER — Telehealth: Payer: Self-pay

## 2018-12-15 ENCOUNTER — Telehealth: Payer: Self-pay | Admitting: *Deleted

## 2018-12-15 NOTE — Telephone Encounter (Signed)
Patient called and scheduled an appt for 1/7 to see Dr.Phelps. The patient will call her insurance company

## 2018-12-15 NOTE — Telephone Encounter (Signed)
LM for Ms Rampey that Dr Doroteo Glassman will see her this month.  Dr. Doroteo Glassman wanted to repeat PAP Smear.  Her insurance may not cover the pap smear as it would be earlier then prescribed.

## 2018-12-19 ENCOUNTER — Telehealth: Payer: Self-pay | Admitting: *Deleted

## 2018-12-19 NOTE — Telephone Encounter (Signed)
Attempted to return the patient's call regarding her appt for tomorrow. No answer, unable to leave a message

## 2018-12-20 ENCOUNTER — Inpatient Hospital Stay: Payer: BLUE CROSS/BLUE SHIELD | Attending: Obstetrics | Admitting: Obstetrics

## 2018-12-20 ENCOUNTER — Encounter: Payer: Self-pay | Admitting: Obstetrics

## 2018-12-20 ENCOUNTER — Telehealth: Payer: Self-pay

## 2018-12-20 ENCOUNTER — Other Ambulatory Visit: Payer: Self-pay

## 2018-12-20 ENCOUNTER — Other Ambulatory Visit (HOSPITAL_COMMUNITY)
Admission: RE | Admit: 2018-12-20 | Discharge: 2018-12-20 | Disposition: A | Discharge status: Home / Self Care | Payer: BLUE CROSS/BLUE SHIELD | Source: Ambulatory Visit | Attending: Obstetrics | Admitting: Obstetrics

## 2018-12-20 VITALS — BP 118/94 | HR 97 | Temp 98.1°F | Resp 18 | Ht 63.0 in | Wt 148.0 lb

## 2018-12-20 DIAGNOSIS — N870 Mild cervical dysplasia: Secondary | ICD-10-CM | POA: Diagnosis not present

## 2018-12-20 DIAGNOSIS — Z1151 Encounter for screening for human papillomavirus (HPV): Secondary | ICD-10-CM | POA: Diagnosis not present

## 2018-12-20 DIAGNOSIS — Z1239 Encounter for other screening for malignant neoplasm of breast: Secondary | ICD-10-CM

## 2018-12-20 NOTE — Telephone Encounter (Signed)
See note

## 2018-12-20 NOTE — Telephone Encounter (Signed)
Patient called today requesting a mammogram. Patient has not had one done recently. I went ahead and ordered mammogram for patient and have contacted her as well. Just wanted to let you know!
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
## 2018-12-20 NOTE — Progress Notes (Signed)
Contacted patient and instead of having a mammogram, patient states she has to have an ultrasound due to her having very dense breast tissue and artificial implants in her breasts. I wanted to route this back to you to ask and see what you wanted me to do?

## 2018-12-20 NOTE — Progress Notes (Signed)
I do not think they will do that nor will insurance pay for that. Call downstairs to imaging and see what they say and how I should order.

## 2018-12-20 NOTE — Patient Instructions (Signed)
1. Return in 6 months for Pap and ECC 2. We will let you know today's results and if they need to be followed up sooner

## 2018-12-21 NOTE — Progress Notes (Signed)
Please inform patient

## 2018-12-21 NOTE — Progress Notes (Signed)
Called and spoke to someone in imaging. The mammogram technician confirmed that ultrasounds are never to be used as a first diagnostic tool. The only time ultrasounds are used is after something abnormal is found in a mammogram. They do mammograms on patients with implants and dense breasts all the time, so she will still need to just start with a regular mammogram.

## 2018-12-22 ENCOUNTER — Telehealth: Payer: Self-pay | Admitting: *Deleted

## 2018-12-22 ENCOUNTER — Other Ambulatory Visit: Payer: Self-pay

## 2018-12-22 DIAGNOSIS — R300 Dysuria: Secondary | ICD-10-CM

## 2018-12-22 NOTE — Progress Notes (Signed)
Called and left a voicemail informing patient of recommendations and results. I also provided the office's contact information if any further questions or concerns.

## 2018-12-22 NOTE — Telephone Encounter (Signed)
Patient called and stated that "I'm having pain and been nauseated the last couple days, since the procedure. My pain level is at an 8, but the pain medicine is helping. I'm also having some bleeding just spotting. I am having to urinate frequently and feel like I'm not emptying my bladder." Per Melissa APP "patient scheduled for a urine sample tomorrow and to call the office if the bleeding gets worse than spotting. The spotting is normal from the procedure done."

## 2018-12-23 ENCOUNTER — Inpatient Hospital Stay: Payer: BLUE CROSS/BLUE SHIELD

## 2018-12-23 LAB — CYTOLOGY - PAP
Diagnosis: NEGATIVE
HPV: NOT DETECTED

## 2018-12-26 ENCOUNTER — Telehealth: Payer: Self-pay

## 2018-12-26 DIAGNOSIS — M542 Cervicalgia: Secondary | ICD-10-CM | POA: Diagnosis not present

## 2018-12-26 DIAGNOSIS — M47812 Spondylosis without myelopathy or radiculopathy, cervical region: Secondary | ICD-10-CM | POA: Diagnosis not present

## 2018-12-26 NOTE — Telephone Encounter (Signed)
LM for patient to call back to discuss results. 

## 2018-12-26 NOTE — Telephone Encounter (Signed)
Told Debbie Hancock that the ECC showed the mild dysplasia  CIN-1 that she has had. The Pap Smear was fine and the HPV High risk was not detected.  Follow up in 6 months per Dr. Doroteo Glassman.  She needs to call in June to schedule an appointment for July. Schedule is not open yet for July.

## 2018-12-29 ENCOUNTER — Telehealth: Payer: Self-pay | Admitting: *Deleted

## 2018-12-29 NOTE — Telephone Encounter (Signed)
Patient called the office stating that "I'm returning a phone call." When patient was told "that is was an old call regarding her test results," patient responded "I haven't talked to everyone." Explained to the patient "It looks like you spoke with the nurse on Monday," patient again responded "I haven't spoke with everyone about the results." Placed the patient on hold and asked the nurse to speak to the patient. The nurse tried to retrieve the call, line was disconnected.

## 2019-01-05 ENCOUNTER — Ambulatory Visit: Payer: Self-pay

## 2019-01-06 ENCOUNTER — Ambulatory Visit: Payer: BLUE CROSS/BLUE SHIELD | Admitting: Psychology

## 2019-01-11 ENCOUNTER — Encounter: Payer: Self-pay | Admitting: Physician Assistant

## 2019-01-11 ENCOUNTER — Ambulatory Visit (INDEPENDENT_AMBULATORY_CARE_PROVIDER_SITE_OTHER): Payer: BLUE CROSS/BLUE SHIELD | Admitting: Physician Assistant

## 2019-01-11 ENCOUNTER — Ambulatory Visit: Payer: Self-pay

## 2019-01-11 VITALS — BP 157/88 | HR 89 | Ht 63.0 in | Wt 158.0 lb

## 2019-01-11 DIAGNOSIS — R350 Frequency of micturition: Secondary | ICD-10-CM

## 2019-01-11 DIAGNOSIS — T50905A Adverse effect of unspecified drugs, medicaments and biological substances, initial encounter: Secondary | ICD-10-CM | POA: Insufficient documentation

## 2019-01-11 DIAGNOSIS — F332 Major depressive disorder, recurrent severe without psychotic features: Secondary | ICD-10-CM

## 2019-01-11 DIAGNOSIS — R609 Edema, unspecified: Secondary | ICD-10-CM | POA: Diagnosis not present

## 2019-01-11 DIAGNOSIS — R45851 Suicidal ideations: Secondary | ICD-10-CM | POA: Diagnosis not present

## 2019-01-11 DIAGNOSIS — F3181 Bipolar II disorder: Secondary | ICD-10-CM

## 2019-01-11 DIAGNOSIS — F322 Major depressive disorder, single episode, severe without psychotic features: Secondary | ICD-10-CM | POA: Diagnosis not present

## 2019-01-11 DIAGNOSIS — F411 Generalized anxiety disorder: Secondary | ICD-10-CM

## 2019-01-11 LAB — POCT URINALYSIS DIPSTICK
BILIRUBIN UA: NEGATIVE
Blood, UA: NEGATIVE
Glucose, UA: NEGATIVE
Ketones, UA: NEGATIVE
Leukocytes, UA: NEGATIVE
Nitrite, UA: NEGATIVE
PH UA: 7 (ref 5.0–8.0)
Protein, UA: NEGATIVE
Spec Grav, UA: <=1.005 — AB (ref 1.010–1.025)
Urobilinogen, UA: 0.2 E.U./dL
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
## 2019-01-12 DIAGNOSIS — F609 Personality disorder, unspecified: Secondary | ICD-10-CM | POA: Diagnosis not present

## 2019-01-12 DIAGNOSIS — F333 Major depressive disorder, recurrent, severe with psychotic symptoms: Secondary | ICD-10-CM | POA: Diagnosis not present

## 2019-01-13 ENCOUNTER — Ambulatory Visit: Payer: BLUE CROSS/BLUE SHIELD | Admitting: Physician Assistant

## 2019-01-13 DIAGNOSIS — F322 Major depressive disorder, single episode, severe without psychotic features: Secondary | ICD-10-CM | POA: Diagnosis not present

## 2019-01-14 DIAGNOSIS — F322 Major depressive disorder, single episode, severe without psychotic features: Secondary | ICD-10-CM | POA: Diagnosis not present

## 2019-01-15 DIAGNOSIS — F322 Major depressive disorder, single episode, severe without psychotic features: Secondary | ICD-10-CM | POA: Diagnosis not present

## 2019-01-16 ENCOUNTER — Ambulatory Visit: Payer: BLUE CROSS/BLUE SHIELD | Admitting: Physician Assistant

## 2019-01-17 ENCOUNTER — Other Ambulatory Visit: Payer: Self-pay

## 2019-01-17 DIAGNOSIS — G894 Chronic pain syndrome: Secondary | ICD-10-CM

## 2019-01-17 DIAGNOSIS — Z0289 Encounter for other administrative examinations: Secondary | ICD-10-CM

## 2019-01-17 DIAGNOSIS — M2550 Pain in unspecified joint: Secondary | ICD-10-CM

## 2019-01-17 DIAGNOSIS — M5030 Other cervical disc degeneration, unspecified cervical region: Secondary | ICD-10-CM

## 2019-01-17 MED ORDER — HYDROCODONE-ACETAMINOPHEN 10-325 MG PO TABS: 1.0000 | ORAL_TABLET | Freq: Two times a day (BID) | ORAL | 0 refills | Status: DC | PRN

## 2019-01-17 NOTE — Telephone Encounter (Signed)
Cinde requests a refill on Hydrocodone.

## 2019-01-20 ENCOUNTER — Ambulatory Visit: Payer: Self-pay | Admitting: Physician Assistant

## 2019-01-25 ENCOUNTER — Ambulatory Visit: Payer: Self-pay | Admitting: Physician Assistant

## 2019-01-30 ENCOUNTER — Ambulatory Visit (INDEPENDENT_AMBULATORY_CARE_PROVIDER_SITE_OTHER): Payer: Medicare Other | Admitting: Physician Assistant

## 2019-01-30 ENCOUNTER — Encounter: Payer: Self-pay | Admitting: Physician Assistant

## 2019-01-30 VITALS — BP 144/88 | HR 90 | Ht 63.0 in | Wt 160.0 lb

## 2019-01-30 DIAGNOSIS — M2550 Pain in unspecified joint: Secondary | ICD-10-CM | POA: Diagnosis not present

## 2019-01-30 DIAGNOSIS — G894 Chronic pain syndrome: Secondary | ICD-10-CM | POA: Diagnosis not present

## 2019-01-30 DIAGNOSIS — F332 Major depressive disorder, recurrent severe without psychotic features: Secondary | ICD-10-CM | POA: Diagnosis not present

## 2019-01-30 DIAGNOSIS — F411 Generalized anxiety disorder: Secondary | ICD-10-CM | POA: Diagnosis not present

## 2019-01-30 DIAGNOSIS — M797 Fibromyalgia: Secondary | ICD-10-CM

## 2019-01-30 MED ORDER — SERTRALINE HCL 50 MG PO TABS: 50.0000 mg | ORAL_TABLET | Freq: Every day | ORAL | 0 refills | Status: DC

## 2019-01-30 MED ORDER — GABAPENTIN 600 MG PO TABS: ORAL_TABLET | ORAL | 1 refills | Status: DC

## 2019-01-30 MED ORDER — CELECOXIB 200 MG PO CAPS: 200.0000 mg | ORAL_CAPSULE | Freq: Two times a day (BID) | ORAL | 2 refills | Status: DC
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
## 2019-02-03 ENCOUNTER — Encounter: Payer: Self-pay | Admitting: Physician Assistant

## 2019-02-14 ENCOUNTER — Other Ambulatory Visit: Payer: Self-pay

## 2019-02-14 DIAGNOSIS — M5030 Other cervical disc degeneration, unspecified cervical region: Secondary | ICD-10-CM

## 2019-02-14 DIAGNOSIS — G894 Chronic pain syndrome: Secondary | ICD-10-CM

## 2019-02-14 DIAGNOSIS — Z0289 Encounter for other administrative examinations: Secondary | ICD-10-CM

## 2019-02-14 DIAGNOSIS — M2550 Pain in unspecified joint: Secondary | ICD-10-CM

## 2019-02-14 MED ORDER — HYDROCODONE-ACETAMINOPHEN 10-325 MG PO TABS: 1.0000 | ORAL_TABLET | Freq: Two times a day (BID) | ORAL | 0 refills | Status: DC | PRN

## 2019-02-14 NOTE — Progress Notes (Signed)
Done

## 2019-02-14 NOTE — Progress Notes (Signed)
Pt requesting refill of her norco. Will forward to provider for approval.

## 2019-03-03 ENCOUNTER — Encounter: Payer: Self-pay | Admitting: Physician Assistant

## 2019-03-03 ENCOUNTER — Other Ambulatory Visit: Payer: Self-pay

## 2019-03-03 ENCOUNTER — Ambulatory Visit (INDEPENDENT_AMBULATORY_CARE_PROVIDER_SITE_OTHER): Payer: Medicare Other | Admitting: Physician Assistant

## 2019-03-03 VITALS — BP 121/59 | HR 96 | Temp 98.2°F | Ht 63.0 in | Wt 165.0 lb

## 2019-03-03 DIAGNOSIS — K219 Gastro-esophageal reflux disease without esophagitis: Secondary | ICD-10-CM

## 2019-03-03 DIAGNOSIS — E782 Mixed hyperlipidemia: Secondary | ICD-10-CM | POA: Diagnosis not present

## 2019-03-03 DIAGNOSIS — J0140 Acute pansinusitis, unspecified: Secondary | ICD-10-CM

## 2019-03-03 DIAGNOSIS — I10 Essential (primary) hypertension: Secondary | ICD-10-CM

## 2019-03-03 DIAGNOSIS — M797 Fibromyalgia: Secondary | ICD-10-CM | POA: Diagnosis not present

## 2019-03-03 DIAGNOSIS — M2550 Pain in unspecified joint: Secondary | ICD-10-CM | POA: Diagnosis not present

## 2019-03-03 DIAGNOSIS — M5030 Other cervical disc degeneration, unspecified cervical region: Secondary | ICD-10-CM | POA: Diagnosis not present

## 2019-03-03 DIAGNOSIS — G894 Chronic pain syndrome: Secondary | ICD-10-CM | POA: Diagnosis not present

## 2019-03-03 DIAGNOSIS — F332 Major depressive disorder, recurrent severe without psychotic features: Secondary | ICD-10-CM | POA: Diagnosis not present

## 2019-03-03 MED ORDER — QUETIAPINE FUMARATE 100 MG PO TABS: 100.0000 mg | ORAL_TABLET | Freq: Two times a day (BID) | ORAL | 3 refills | Status: DC

## 2019-03-03 MED ORDER — OMEPRAZOLE 40 MG PO CPDR: 40.0000 mg | DELAYED_RELEASE_CAPSULE | Freq: Every day | ORAL | 3 refills | Status: DC

## 2019-03-03 MED ORDER — ATORVASTATIN CALCIUM 20 MG PO TABS: 20.0000 mg | ORAL_TABLET | Freq: Every day | ORAL | 3 refills | Status: DC

## 2019-03-03 MED ORDER — LISINOPRIL 10 MG PO TABS: 10.0000 mg | ORAL_TABLET | Freq: Every day | ORAL | 0 refills | Status: DC

## 2019-03-03 MED ORDER — CELECOXIB 200 MG PO CAPS: 200.0000 mg | ORAL_CAPSULE | Freq: Two times a day (BID) | ORAL | 3 refills | Status: DC

## 2019-03-03 MED ORDER — AMOXICILLIN-POT CLAVULANATE 875-125 MG PO TABS: 1.0000 | ORAL_TABLET | Freq: Two times a day (BID) | ORAL | 0 refills | Status: DC

## 2019-03-03 MED ORDER — SERTRALINE HCL 50 MG PO TABS: 50.0000 mg | ORAL_TABLET | Freq: Every day | ORAL | 3 refills | Status: DC
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
## 2019-03-03 NOTE — Patient Instructions (Signed)

## 2019-03-06 ENCOUNTER — Ambulatory Visit: Payer: Self-pay | Admitting: Physician Assistant

## 2019-03-15 ENCOUNTER — Telehealth: Payer: Self-pay | Admitting: Physician Assistant

## 2019-03-15 DIAGNOSIS — G894 Chronic pain syndrome: Secondary | ICD-10-CM

## 2019-03-15 DIAGNOSIS — M2550 Pain in unspecified joint: Secondary | ICD-10-CM

## 2019-03-15 DIAGNOSIS — M5030 Other cervical disc degeneration, unspecified cervical region: Secondary | ICD-10-CM

## 2019-03-15 DIAGNOSIS — Z0289 Encounter for other administrative examinations: Secondary | ICD-10-CM

## 2019-03-15 MED ORDER — HYDROCODONE-ACETAMINOPHEN 10-325 MG PO TABS: 1.0000 | ORAL_TABLET | Freq: Two times a day (BID) | ORAL | 0 refills | Status: DC | PRN

## 2019-03-15 NOTE — Telephone Encounter (Signed)
On 2/17 90 day supply was sent to [facility] [address] with another refill.  I cannot given 90 day supply of controlled substance. Are you due for your monthly I can send that but can only given 30 days at a time.

## 2019-03-15 NOTE — Telephone Encounter (Signed)
Patient calls and would like a refill on her Gabapentin 600mg  taking twice a day a 90 day supply and for her pain meds a 90 day supply. Send to [facility] [address]

## 2019-03-15 NOTE — Telephone Encounter (Signed)
Pt on pain contract.  Last fill 3/3.  Marland KitchenMarland KitchenPDMP reviewed during this encounter. Refilled for 1 month.

## 2019-03-15 NOTE — Telephone Encounter (Signed)
Pt just needs the 30 day pain med

## 2019-03-15 NOTE — Telephone Encounter (Signed)
Done

## 2019-03-16 ENCOUNTER — Telehealth (INDEPENDENT_AMBULATORY_CARE_PROVIDER_SITE_OTHER): Payer: Self-pay | Admitting: Rheumatology

## 2019-03-16 NOTE — Telephone Encounter (Signed)
Received a call from [facility] checking status of request for records. IC, advised we don't have their request and gave fax [phone] to re send

## 2019-04-13 ENCOUNTER — Other Ambulatory Visit: Payer: Self-pay

## 2019-04-13 DIAGNOSIS — M2550 Pain in unspecified joint: Secondary | ICD-10-CM

## 2019-04-13 DIAGNOSIS — G894 Chronic pain syndrome: Secondary | ICD-10-CM

## 2019-04-13 DIAGNOSIS — M5030 Other cervical disc degeneration, unspecified cervical region: Secondary | ICD-10-CM

## 2019-04-13 DIAGNOSIS — Z0289 Encounter for other administrative examinations: Secondary | ICD-10-CM

## 2019-04-13 NOTE — Telephone Encounter (Signed)
Pt called requesting Norco RX be sent in   Last pain visit was 01/30/19  Upcoming appt 05/01/19  Last RX sent to pharmacy 03/16/19 for #[id number]  RX pended, please review

## 2019-04-14 MED ORDER — HYDROCODONE-ACETAMINOPHEN 10-325 MG PO TABS: 1.0000 | ORAL_TABLET | Freq: Two times a day (BID) | ORAL | 0 refills | Status: DC | PRN

## 2019-04-14 NOTE — Telephone Encounter (Signed)
Pt advised RX sent.

## 2019-04-18 ENCOUNTER — Other Ambulatory Visit: Payer: Self-pay | Admitting: Physician Assistant

## 2019-04-18 DIAGNOSIS — G894 Chronic pain syndrome: Secondary | ICD-10-CM

## 2019-04-18 DIAGNOSIS — M2550 Pain in unspecified joint: Secondary | ICD-10-CM

## 2019-04-18 DIAGNOSIS — M797 Fibromyalgia: Secondary | ICD-10-CM

## 2019-05-01 ENCOUNTER — Ambulatory Visit (INDEPENDENT_AMBULATORY_CARE_PROVIDER_SITE_OTHER): Payer: Medicare Other | Admitting: Physician Assistant

## 2019-05-01 ENCOUNTER — Encounter: Payer: Self-pay | Admitting: Physician Assistant

## 2019-05-01 VITALS — BP 138/93 | HR 96 | Ht 63.0 in | Wt 165.0 lb

## 2019-05-01 DIAGNOSIS — M2550 Pain in unspecified joint: Secondary | ICD-10-CM | POA: Diagnosis not present

## 2019-05-01 DIAGNOSIS — M797 Fibromyalgia: Secondary | ICD-10-CM | POA: Diagnosis not present

## 2019-05-01 DIAGNOSIS — M5030 Other cervical disc degeneration, unspecified cervical region: Secondary | ICD-10-CM | POA: Diagnosis not present

## 2019-05-01 DIAGNOSIS — R05 Cough: Secondary | ICD-10-CM | POA: Diagnosis not present

## 2019-05-01 DIAGNOSIS — G894 Chronic pain syndrome: Secondary | ICD-10-CM

## 2019-05-01 DIAGNOSIS — Z0289 Encounter for other administrative examinations: Secondary | ICD-10-CM | POA: Diagnosis not present

## 2019-05-01 DIAGNOSIS — R059 Cough, unspecified: Secondary | ICD-10-CM

## 2019-05-01 DIAGNOSIS — R062 Wheezing: Secondary | ICD-10-CM | POA: Diagnosis not present

## 2019-05-01 MED ORDER — HYDROCODONE-ACETAMINOPHEN 10-325 MG PO TABS: 1.0000 | ORAL_TABLET | Freq: Two times a day (BID) | ORAL | 0 refills | Status: DC | PRN

## 2019-05-01 MED ORDER — GABAPENTIN 800 MG PO TABS: 800.0000 mg | ORAL_TABLET | Freq: Three times a day (TID) | ORAL | 1 refills | Status: DC

## 2019-05-01 MED ORDER — ALBUTEROL SULFATE HFA 108 (90 BASE) MCG/ACT IN AERS: 2.0000 | INHALATION_SPRAY | Freq: Four times a day (QID) | RESPIRATORY_TRACT | 0 refills | Status: DC | PRN
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
## 2019-05-02 ENCOUNTER — Encounter: Payer: Self-pay | Admitting: Physician Assistant

## 2019-05-22 ENCOUNTER — Other Ambulatory Visit: Payer: Self-pay | Admitting: Physician Assistant

## 2019-05-22 DIAGNOSIS — I10 Essential (primary) hypertension: Secondary | ICD-10-CM

## 2019-06-07 ENCOUNTER — Ambulatory Visit (INDEPENDENT_AMBULATORY_CARE_PROVIDER_SITE_OTHER): Payer: Medicare Other | Admitting: Physician Assistant

## 2019-06-07 ENCOUNTER — Encounter: Payer: Self-pay | Admitting: Physician Assistant

## 2019-06-07 VITALS — BP 125/78 | HR 89 | Temp 98.4°F | Ht 63.0 in | Wt 171.0 lb

## 2019-06-07 DIAGNOSIS — K219 Gastro-esophageal reflux disease without esophagitis: Secondary | ICD-10-CM | POA: Diagnosis not present

## 2019-06-07 DIAGNOSIS — M2550 Pain in unspecified joint: Secondary | ICD-10-CM | POA: Diagnosis not present

## 2019-06-07 DIAGNOSIS — E782 Mixed hyperlipidemia: Secondary | ICD-10-CM

## 2019-06-07 DIAGNOSIS — M5030 Other cervical disc degeneration, unspecified cervical region: Secondary | ICD-10-CM

## 2019-06-07 DIAGNOSIS — G894 Chronic pain syndrome: Secondary | ICD-10-CM

## 2019-06-07 DIAGNOSIS — M797 Fibromyalgia: Secondary | ICD-10-CM | POA: Diagnosis not present

## 2019-06-07 DIAGNOSIS — N3281 Overactive bladder: Secondary | ICD-10-CM

## 2019-06-07 DIAGNOSIS — F332 Major depressive disorder, recurrent severe without psychotic features: Secondary | ICD-10-CM

## 2019-06-07 MED ORDER — NORTRIPTYLINE HCL 25 MG PO CAPS: 25.0000 mg | ORAL_CAPSULE | Freq: Two times a day (BID) | ORAL | 1 refills | Status: DC

## 2019-06-07 MED ORDER — ATORVASTATIN CALCIUM 20 MG PO TABS: 20.0000 mg | ORAL_TABLET | Freq: Every day | ORAL | 3 refills | Status: DC

## 2019-06-07 MED ORDER — OMEPRAZOLE 40 MG PO CPDR: 40.0000 mg | DELAYED_RELEASE_CAPSULE | Freq: Every day | ORAL | 3 refills | Status: DC

## 2019-06-07 MED ORDER — SERTRALINE HCL 50 MG PO TABS: 50.0000 mg | ORAL_TABLET | Freq: Every day | ORAL | 3 refills | Status: DC

## 2019-06-07 MED ORDER — MIRABEGRON ER 25 MG PO TB24: 50.0000 mg | ORAL_TABLET | Freq: Three times a day (TID) | ORAL | 1 refills | Status: DC

## 2019-06-07 MED ORDER — QUETIAPINE FUMARATE 100 MG PO TABS: 100.0000 mg | ORAL_TABLET | Freq: Two times a day (BID) | ORAL | 3 refills | Status: DC

## 2019-06-07 MED ORDER — GABAPENTIN 800 MG PO TABS: 800.0000 mg | ORAL_TABLET | Freq: Three times a day (TID) | ORAL | 1 refills | Status: DC

## 2019-06-07 MED ORDER — CELECOXIB 200 MG PO CAPS: 200.0000 mg | ORAL_CAPSULE | Freq: Two times a day (BID) | ORAL | 3 refills | Status: DC
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
## 2019-06-07 NOTE — Patient Instructions (Signed)
Added nortriptyline.

## 2019-06-15 ENCOUNTER — Telehealth: Payer: Self-pay | Admitting: Neurology

## 2019-06-15 DIAGNOSIS — R6889 Other general symptoms and signs: Secondary | ICD-10-CM

## 2019-06-15 NOTE — Telephone Encounter (Signed)
Patient left vm requesting a referral to an endocrinologist in network to evaluate her thyroid. It doesn't look like she has had any recent lab work for thyroid. Please advise on referral. Patient stated not urgent, will await advise from [facility].

## 2019-06-19 NOTE — Telephone Encounter (Signed)
Lets first check her labs first. TSH, Free T3 and T4.

## 2019-06-19 NOTE — Telephone Encounter (Signed)
Spoke with patient and she was made aware we will order lab work. She then started asking about a sleep study and having issues with snoring/wheezing at night. She decided to schedule an appt with [facility] for follow up next week to discuss, states she has had this issue for months.   Labs ordered.

## 2019-06-21 DIAGNOSIS — R6889 Other general symptoms and signs: Secondary | ICD-10-CM | POA: Diagnosis not present

## 2019-06-22 ENCOUNTER — Telehealth: Payer: Self-pay | Admitting: *Deleted

## 2019-06-22 LAB — T3, FREE: T3, Free: 2.7 pg/mL (ref 2.3–4.2)

## 2019-06-22 LAB — TSH: TSH: 1.02 mIU/L

## 2019-06-22 LAB — T4, FREE: Free T4: 1.2 ng/dL (ref 0.8–1.8)

## 2019-06-22 NOTE — Telephone Encounter (Signed)
Patient called and requested her medical records to be sent to her home. Per the patient she is moving back to [place]. Referred the patient to HIM

## 2019-06-27 ENCOUNTER — Telehealth: Payer: Self-pay | Admitting: Physician Assistant

## 2019-06-27 DIAGNOSIS — Z1239 Encounter for other screening for malignant neoplasm of breast: Secondary | ICD-10-CM

## 2019-06-27 NOTE — Telephone Encounter (Signed)
Order entered for mammogram. KG LPN

## 2019-06-28 ENCOUNTER — Other Ambulatory Visit: Payer: Self-pay

## 2019-06-28 ENCOUNTER — Ambulatory Visit (INDEPENDENT_AMBULATORY_CARE_PROVIDER_SITE_OTHER): Payer: Medicare Other | Admitting: Physician Assistant

## 2019-06-28 ENCOUNTER — Ambulatory Visit (INDEPENDENT_AMBULATORY_CARE_PROVIDER_SITE_OTHER): Payer: Medicare Other

## 2019-06-28 VITALS — BP 132/93 | HR 101 | Temp 98.4°F | Ht 63.0 in | Wt 170.0 lb

## 2019-06-28 DIAGNOSIS — N644 Mastodynia: Secondary | ICD-10-CM

## 2019-06-28 DIAGNOSIS — F332 Major depressive disorder, recurrent severe without psychotic features: Secondary | ICD-10-CM

## 2019-06-28 DIAGNOSIS — F419 Anxiety disorder, unspecified: Secondary | ICD-10-CM | POA: Diagnosis not present

## 2019-06-28 DIAGNOSIS — N3010 Interstitial cystitis (chronic) without hematuria: Secondary | ICD-10-CM

## 2019-06-28 DIAGNOSIS — F3181 Bipolar II disorder: Secondary | ICD-10-CM

## 2019-06-28 DIAGNOSIS — R300 Dysuria: Secondary | ICD-10-CM

## 2019-06-28 DIAGNOSIS — Z9882 Breast implant status: Secondary | ICD-10-CM

## 2019-06-28 DIAGNOSIS — Z1239 Encounter for other screening for malignant neoplasm of breast: Secondary | ICD-10-CM

## 2019-06-28 DIAGNOSIS — F411 Generalized anxiety disorder: Secondary | ICD-10-CM

## 2019-06-28 LAB — POCT URINALYSIS DIPSTICK
Bilirubin, UA: NEGATIVE
Blood, UA: NEGATIVE
Glucose, UA: NEGATIVE
Leukocytes, UA: NEGATIVE
Nitrite, UA: NEGATIVE
Protein, UA: NEGATIVE
Spec Grav, UA: >=1.03 — AB (ref 1.010–1.025)
Urobilinogen, UA: 1 E.U./dL
pH, UA: 6.5 (ref 5.0–8.0)

## 2019-06-28 MED ORDER — BUSPIRONE HCL 5 MG PO TABS: 5.0000 mg | ORAL_TABLET | Freq: Three times a day (TID) | ORAL | 2 refills | Status: DC

## 2019-06-28 MED ORDER — SERTRALINE HCL 100 MG PO TABS: 100.0000 mg | ORAL_TABLET | Freq: Every day | ORAL | 2 refills | Status: DC
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
## 2019-06-29 ENCOUNTER — Telehealth: Payer: Self-pay | Admitting: *Deleted

## 2019-06-29 NOTE — Telephone Encounter (Signed)
Patient stated that she tried to come in the office today to sign a medical record release form, but was not aloud in the building. Patient stated that she had a fever. Per her request I have mailed her the form.

## 2019-06-29 NOTE — Telephone Encounter (Signed)
Normal mammogram. Follow up in 1 year.

## 2019-06-30 ENCOUNTER — Encounter: Payer: Self-pay | Admitting: Physician Assistant

## 2019-06-30 DIAGNOSIS — N644 Mastodynia: Secondary | ICD-10-CM | POA: Insufficient documentation

## 2019-06-30 LAB — URINE CULTURE
MICRO NUMBER:: 669824
Result:: NO GROWTH
SPECIMEN QUALITY:: ADEQUATE

## 2019-06-30 NOTE — Progress Notes (Signed)
Culture confirmed no bacteria growth in urine.

## 2019-07-04 ENCOUNTER — Telehealth: Payer: Self-pay | Admitting: Physician Assistant

## 2019-07-04 NOTE — Telephone Encounter (Signed)
Spoke with patient regarding Carisa Backhaus's recommendation. She is agreeable to spirometry.   Delsa Sale- can you call and schedule nurse visit for testing?

## 2019-07-04 NOTE — Telephone Encounter (Signed)
Left message on machine for patient to call back.

## 2019-07-04 NOTE — Telephone Encounter (Signed)
It can take some time to get in with pulmonology. Since you do not have any past diagnosis of lung disease this is probably an acute issue. We can do spirometry here in our office. Usually we start a work up here and if something concerning or not improving go to pulmonology. Does that make sense?

## 2019-07-04 NOTE — Telephone Encounter (Signed)
Comments: goodmorning dr Alden Hipp.. could you please refere me to a pulmonologist... Dr. Glenford Peers in [place],  his phone number is [phone]... i am breathing hard and i am weezing .. would like to get my lungs checked and a sleep study.. my sis says she can hear from the other room how terrible i sound when inhaling and exhaling... thank you

## 2019-07-04 NOTE — Telephone Encounter (Signed)
Thank you :)

## 2019-07-05 NOTE — Telephone Encounter (Signed)
thanks

## 2019-07-05 NOTE — Telephone Encounter (Signed)
I scheduled pt for Spirometry on Aug 5th and patient aware

## 2019-07-12 ENCOUNTER — Telehealth: Payer: Self-pay | Admitting: Neurology

## 2019-07-12 NOTE — Telephone Encounter (Signed)
We can call pharmacy and ask to fill early. Ok Thursday or Friday which ever date she is leaving.

## 2019-07-12 NOTE — Telephone Encounter (Signed)
Left vm asking how early could she pick up pain medication RX because she wants to go out of town. Looks like earliest fill date on RX is 07/15/2019. Please advise.

## 2019-07-13 NOTE — Telephone Encounter (Signed)
Called pharmacy and authorized early refill.   LMOM making patient aware this is authorized.

## 2019-07-19 ENCOUNTER — Other Ambulatory Visit: Payer: Medicare Other

## 2019-08-08 LAB — HM DIABETES EYE EXAM

## 2019-08-09 ENCOUNTER — Encounter: Payer: Self-pay | Admitting: Physician Assistant

## 2019-08-09 ENCOUNTER — Ambulatory Visit (INDEPENDENT_AMBULATORY_CARE_PROVIDER_SITE_OTHER): Payer: Medicare Other | Admitting: Physician Assistant

## 2019-08-09 VITALS — Ht 63.0 in | Wt 170.0 lb

## 2019-08-09 DIAGNOSIS — R52 Pain, unspecified: Secondary | ICD-10-CM
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
## 2019-08-09 NOTE — Progress Notes (Deleted)
Surgery to have implants removed - 08/22/2019 On pain contract with our office so they don't want to give her pain medication Wants to discuss

## 2019-08-11 ENCOUNTER — Other Ambulatory Visit: Payer: Self-pay | Admitting: Neurology

## 2019-08-11 DIAGNOSIS — M5030 Other cervical disc degeneration, unspecified cervical region: Secondary | ICD-10-CM

## 2019-08-11 DIAGNOSIS — Z0289 Encounter for other administrative examinations: Secondary | ICD-10-CM

## 2019-08-11 DIAGNOSIS — M2550 Pain in unspecified joint: Secondary | ICD-10-CM

## 2019-08-11 DIAGNOSIS — M797 Fibromyalgia: Secondary | ICD-10-CM

## 2019-08-11 DIAGNOSIS — G894 Chronic pain syndrome: Secondary | ICD-10-CM

## 2019-08-11 MED ORDER — HYDROCODONE-ACETAMINOPHEN 10-325 MG PO TABS: 1.0000 | ORAL_TABLET | Freq: Two times a day (BID) | ORAL | 0 refills | Status: DC | PRN

## 2019-08-11 NOTE — Telephone Encounter (Signed)
Patient left vm asking for refill of hydrocodone to be sent so she can pick up Sunday. Last fill 07/15/2019. Sunday would be 30 days.

## 2019-08-16 ENCOUNTER — Telehealth: Payer: Self-pay

## 2019-08-16 NOTE — Telephone Encounter (Signed)
Pharmacist advised

## 2019-08-16 NOTE — Telephone Encounter (Signed)
Received call from [facility] in [place] stating that patient received an RX for Norco 5/325 for 1-2 tabs Q 4-6 hours #40 from Dr Manning Charity for an elective surgery.  Patient is on pain contract here with [facility]. [facility] holding the RX until they hear from provider.. please advise

## 2019-08-16 NOTE — Telephone Encounter (Signed)
We had an office visit where she agreed to try to continue my twice daily dosing after surgery. Im ok with her using this supply for the 7 days post surgery. Can we post date next rx from my office to account for that?

## 2019-08-17 ENCOUNTER — Encounter: Payer: Self-pay | Admitting: Physician Assistant

## 2019-08-29 ENCOUNTER — Ambulatory Visit (INDEPENDENT_AMBULATORY_CARE_PROVIDER_SITE_OTHER): Payer: Medicare Other | Admitting: Physician Assistant

## 2019-08-29 VITALS — BP 140/93 | HR 125 | Temp 98.3°F | Ht 63.0 in | Wt 164.0 lb

## 2019-08-29 DIAGNOSIS — N898 Other specified noninflammatory disorders of vagina: Secondary | ICD-10-CM | POA: Diagnosis not present

## 2019-08-29 DIAGNOSIS — F419 Anxiety disorder, unspecified: Secondary | ICD-10-CM

## 2019-08-29 DIAGNOSIS — R000 Tachycardia, unspecified: Secondary | ICD-10-CM | POA: Diagnosis not present

## 2019-08-29 DIAGNOSIS — F332 Major depressive disorder, recurrent severe without psychotic features: Secondary | ICD-10-CM | POA: Diagnosis not present

## 2019-08-29 DIAGNOSIS — F3181 Bipolar II disorder: Secondary | ICD-10-CM

## 2019-08-29 DIAGNOSIS — H9201 Otalgia, right ear: Secondary | ICD-10-CM | POA: Diagnosis not present

## 2019-08-29 MED ORDER — METRONIDAZOLE 500 MG PO TABS: 500.0000 mg | ORAL_TABLET | Freq: Two times a day (BID) | ORAL | 0 refills | Status: DC

## 2019-08-29 MED ORDER — SERTRALINE HCL 100 MG PO TABS: 100.0000 mg | ORAL_TABLET | Freq: Every day | ORAL | 2 refills | Status: DC

## 2019-08-29 MED ORDER — OFLOXACIN 0.3 % OT SOLN: 10.0000 [drp] | Freq: Every day | OTIC | 0 refills | Status: DC

## 2019-08-29 MED ORDER — FLUCONAZOLE 150 MG PO TABS: 150.0000 mg | ORAL_TABLET | Freq: Once | ORAL | 0 refills | Status: AC

## 2019-08-30 ENCOUNTER — Encounter: Payer: Self-pay | Admitting: Physician Assistant

## 2019-08-30 DIAGNOSIS — R000 Tachycardia, unspecified: Secondary | ICD-10-CM | POA: Insufficient documentation
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
## 2019-09-01 ENCOUNTER — Encounter: Payer: Self-pay | Admitting: Physician Assistant

## 2019-09-04 ENCOUNTER — Ambulatory Visit (INDEPENDENT_AMBULATORY_CARE_PROVIDER_SITE_OTHER): Payer: Medicare Other | Admitting: Family Medicine

## 2019-09-04 ENCOUNTER — Encounter: Payer: Self-pay | Admitting: Family Medicine

## 2019-09-04 VITALS — BP 133/95 | HR 107 | Temp 98.5°F | Wt 164.0 lb

## 2019-09-04 DIAGNOSIS — H9201 Otalgia, right ear: Secondary | ICD-10-CM | POA: Diagnosis not present

## 2019-09-04 DIAGNOSIS — R05 Cough: Secondary | ICD-10-CM

## 2019-09-04 DIAGNOSIS — R058 Other specified cough: Secondary | ICD-10-CM

## 2019-09-04 MED ORDER — CEFDINIR 300 MG PO CAPS: 300.0000 mg | ORAL_CAPSULE | Freq: Two times a day (BID) | ORAL | 0 refills | Status: DC
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
## 2019-09-08 ENCOUNTER — Ambulatory Visit: Payer: Medicare Other | Admitting: Physician Assistant

## 2019-09-12 ENCOUNTER — Telehealth: Payer: Self-pay | Admitting: Neurology

## 2019-09-12 NOTE — Telephone Encounter (Signed)
[facility] for 1 day early pick up.

## 2019-09-12 NOTE — Telephone Encounter (Signed)
Patient left vm asking [place] to approve refill for her pain medication. She can get tomorrow, but that is 31 days and she wants approval to pick up today. Please advise.

## 2019-09-12 NOTE — Telephone Encounter (Signed)
Verbal okay given to [facility]. They will fill.

## 2019-09-13 ENCOUNTER — Ambulatory Visit: Payer: Medicare Other | Admitting: Physician Assistant

## 2019-10-12 ENCOUNTER — Other Ambulatory Visit: Payer: Self-pay | Admitting: Physician Assistant

## 2019-10-12 ENCOUNTER — Encounter: Payer: Self-pay | Admitting: Physician Assistant

## 2019-10-12 DIAGNOSIS — M5030 Other cervical disc degeneration, unspecified cervical region: Secondary | ICD-10-CM

## 2019-10-12 DIAGNOSIS — Z0289 Encounter for other administrative examinations: Secondary | ICD-10-CM

## 2019-10-12 DIAGNOSIS — M797 Fibromyalgia: Secondary | ICD-10-CM

## 2019-10-12 DIAGNOSIS — G894 Chronic pain syndrome: Secondary | ICD-10-CM

## 2019-10-12 DIAGNOSIS — M2550 Pain in unspecified joint: Secondary | ICD-10-CM

## 2019-11-13 ENCOUNTER — Other Ambulatory Visit: Payer: Self-pay

## 2019-11-13 ENCOUNTER — Ambulatory Visit (INDEPENDENT_AMBULATORY_CARE_PROVIDER_SITE_OTHER): Payer: Medicare Other | Admitting: Physician Assistant

## 2019-11-13 VITALS — BP 132/93 | HR 114 | Ht 63.0 in | Wt 171.0 lb

## 2019-11-13 DIAGNOSIS — R300 Dysuria: Secondary | ICD-10-CM | POA: Diagnosis not present

## 2019-11-13 DIAGNOSIS — M2550 Pain in unspecified joint: Secondary | ICD-10-CM

## 2019-11-13 DIAGNOSIS — M5030 Other cervical disc degeneration, unspecified cervical region: Secondary | ICD-10-CM

## 2019-11-13 DIAGNOSIS — M797 Fibromyalgia: Secondary | ICD-10-CM | POA: Diagnosis not present

## 2019-11-13 DIAGNOSIS — R52 Pain, unspecified: Secondary | ICD-10-CM | POA: Diagnosis not present

## 2019-11-13 DIAGNOSIS — N3010 Interstitial cystitis (chronic) without hematuria: Secondary | ICD-10-CM

## 2019-11-13 DIAGNOSIS — Z1211 Encounter for screening for malignant neoplasm of colon: Secondary | ICD-10-CM

## 2019-11-13 DIAGNOSIS — G894 Chronic pain syndrome: Secondary | ICD-10-CM

## 2019-11-13 DIAGNOSIS — Z0289 Encounter for other administrative examinations: Secondary | ICD-10-CM

## 2019-11-13 DIAGNOSIS — K219 Gastro-esophageal reflux disease without esophagitis: Secondary | ICD-10-CM

## 2019-11-13 DIAGNOSIS — N3281 Overactive bladder: Secondary | ICD-10-CM

## 2019-11-13 LAB — POCT URINALYSIS DIP (CLINITEK)
Bilirubin, UA: NEGATIVE
Blood, UA: NEGATIVE
Glucose, UA: NEGATIVE mg/dL
Ketones, POC UA: NEGATIVE mg/dL
Leukocytes, UA: NEGATIVE
Nitrite, UA: NEGATIVE
POC PROTEIN,UA: NEGATIVE
Spec Grav, UA: >=1.03 — AB (ref 1.010–1.025)
Urobilinogen, UA: 0.2 E.U./dL
pH, UA: 6 (ref 5.0–8.0)

## 2019-11-13 MED ORDER — MIRABEGRON ER 50 MG PO TB24: ORAL_TABLET | ORAL | 1 refills | Status: DC

## 2019-11-13 MED ORDER — HYDROCODONE-ACETAMINOPHEN 10-325 MG PO TABS: 1.0000 | ORAL_TABLET | Freq: Two times a day (BID) | ORAL | 0 refills | Status: DC | PRN

## 2019-11-13 MED ORDER — FAMOTIDINE 40 MG PO TABS: 40.0000 mg | ORAL_TABLET | Freq: Every day | ORAL | 2 refills | Status: DC

## 2019-11-13 MED ORDER — NORTRIPTYLINE HCL 25 MG PO CAPS: 25.0000 mg | ORAL_CAPSULE | Freq: Two times a day (BID) | ORAL | 1 refills | Status: DC

## 2019-11-13 NOTE — Patient Instructions (Signed)
Will get colonoscopy ordered.  myrbetriq card given.  Medication refills sent.
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
## 2019-11-15 LAB — PAIN MGMT, PROFILE 6 CONF W/O MM, U
6 Acetylmorphine: NEGATIVE ng/mL
Alcohol Metabolites: NEGATIVE ng/mL (ref <500)
Alphahydroxyalprazolam: NEGATIVE ng/mL
Alphahydroxymidazolam: NEGATIVE ng/mL
Alphahydroxytriazolam: NEGATIVE ng/mL
Aminoclonazepam: NEGATIVE ng/mL
Amphetamines: NEGATIVE ng/mL
Barbiturates: NEGATIVE ng/mL
Benzodiazepines: NEGATIVE ng/mL
Cocaine Metabolite: NEGATIVE ng/mL
Codeine: NEGATIVE ng/mL
Creatinine: 136 mg/dL
Hydrocodone: 2399 ng/mL
Hydromorphone: 324 ng/mL
Hydroxyethylflurazepam: NEGATIVE ng/mL
Lorazepam: NEGATIVE ng/mL
Marijuana Metabolite: NEGATIVE ng/mL
Methadone Metabolite: NEGATIVE ng/mL
Morphine: NEGATIVE ng/mL
Nordiazepam: NEGATIVE ng/mL
Norhydrocodone: 4834 ng/mL
Opiates: POSITIVE ng/mL
Oxazepam: NEGATIVE ng/mL
Oxidant: NEGATIVE ug/mL
Oxycodone: NEGATIVE ng/mL
Phencyclidine: NEGATIVE ng/mL
Temazepam: NEGATIVE ng/mL
pH: 6.4 (ref 4.5–9.0)

## 2019-11-20 ENCOUNTER — Encounter: Payer: Self-pay | Admitting: Physician Assistant
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
## 2020-02-12 ENCOUNTER — Other Ambulatory Visit: Payer: Self-pay

## 2020-02-12 ENCOUNTER — Ambulatory Visit (INDEPENDENT_AMBULATORY_CARE_PROVIDER_SITE_OTHER): Payer: Medicare Other | Admitting: Physician Assistant

## 2020-02-12 VITALS — BP 118/77 | HR 105 | Ht 63.0 in | Wt 162.0 lb

## 2020-02-12 DIAGNOSIS — G894 Chronic pain syndrome: Secondary | ICD-10-CM | POA: Diagnosis not present

## 2020-02-12 DIAGNOSIS — M25541 Pain in joints of right hand: Secondary | ICD-10-CM

## 2020-02-12 DIAGNOSIS — N3010 Interstitial cystitis (chronic) without hematuria: Secondary | ICD-10-CM

## 2020-02-12 DIAGNOSIS — M5136 Other intervertebral disc degeneration, lumbar region: Secondary | ICD-10-CM

## 2020-02-12 DIAGNOSIS — M2550 Pain in unspecified joint: Secondary | ICD-10-CM | POA: Diagnosis not present

## 2020-02-12 DIAGNOSIS — M25542 Pain in joints of left hand: Secondary | ICD-10-CM

## 2020-02-12 DIAGNOSIS — M5030 Other cervical disc degeneration, unspecified cervical region: Secondary | ICD-10-CM

## 2020-02-12 DIAGNOSIS — F3181 Bipolar II disorder: Secondary | ICD-10-CM

## 2020-02-12 DIAGNOSIS — Z0289 Encounter for other administrative examinations: Secondary | ICD-10-CM

## 2020-02-12 DIAGNOSIS — Z1211 Encounter for screening for malignant neoplasm of colon: Secondary | ICD-10-CM

## 2020-02-12 DIAGNOSIS — M069 Rheumatoid arthritis, unspecified: Secondary | ICD-10-CM

## 2020-02-12 DIAGNOSIS — M797 Fibromyalgia: Secondary | ICD-10-CM

## 2020-02-12 MED ORDER — HYDROCODONE-ACETAMINOPHEN 10-325 MG PO TABS: 1.0000 | ORAL_TABLET | Freq: Two times a day (BID) | ORAL | 0 refills | Status: DC | PRN

## 2020-02-12 MED ORDER — GABAPENTIN 800 MG PO TABS: 800.0000 mg | ORAL_TABLET | Freq: Three times a day (TID) | ORAL | 1 refills | Status: DC
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
## 2020-02-13 DIAGNOSIS — Z0289 Encounter for other administrative examinations: Secondary | ICD-10-CM | POA: Insufficient documentation

## 2020-03-07 DIAGNOSIS — F4310 Post-traumatic stress disorder, unspecified: Secondary | ICD-10-CM | POA: Diagnosis not present

## 2020-03-07 DIAGNOSIS — F3181 Bipolar II disorder: Secondary | ICD-10-CM | POA: Diagnosis not present

## 2020-04-04 ENCOUNTER — Telehealth (INDEPENDENT_AMBULATORY_CARE_PROVIDER_SITE_OTHER): Payer: Medicare Other | Admitting: Physician Assistant

## 2020-04-04 VITALS — BP 121/90 | Temp 97.5°F | Ht 63.0 in | Wt 160.0 lb

## 2020-04-04 DIAGNOSIS — R059 Cough, unspecified: Secondary | ICD-10-CM | POA: Insufficient documentation

## 2020-04-04 DIAGNOSIS — J302 Other seasonal allergic rhinitis: Secondary | ICD-10-CM

## 2020-04-04 DIAGNOSIS — K2100 Gastro-esophageal reflux disease with esophagitis, without bleeding: Secondary | ICD-10-CM | POA: Diagnosis not present

## 2020-04-04 DIAGNOSIS — R062 Wheezing: Secondary | ICD-10-CM | POA: Diagnosis not present

## 2020-04-04 DIAGNOSIS — R05 Cough: Secondary | ICD-10-CM

## 2020-04-04 DIAGNOSIS — F3181 Bipolar II disorder: Secondary | ICD-10-CM

## 2020-04-04 MED ORDER — SUCRALFATE 1 G PO TABS: 1.0000 g | ORAL_TABLET | Freq: Three times a day (TID) | ORAL | 0 refills | Status: DC

## 2020-04-04 MED ORDER — PANTOPRAZOLE SODIUM 40 MG PO TBEC: 40.0000 mg | DELAYED_RELEASE_TABLET | Freq: Two times a day (BID) | ORAL | 5 refills | Status: DC

## 2020-04-04 MED ORDER — ALBUTEROL SULFATE HFA 108 (90 BASE) MCG/ACT IN AERS: 2.0000 | INHALATION_SPRAY | Freq: Four times a day (QID) | RESPIRATORY_TRACT | 0 refills | Status: DC | PRN

## 2020-04-04 MED ORDER — METHYLPREDNISOLONE 4 MG PO TBPK: ORAL_TABLET | ORAL | 0 refills | Status: DC

## 2020-04-04 NOTE — Progress Notes (Signed)
Wheezing/cough - started 3-4 months ago Throat dry  Dull/achy feeling in vaginal/uterine area
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
## 2020-04-07 ENCOUNTER — Encounter: Payer: Self-pay | Admitting: Physician Assistant

## 2020-04-11 ENCOUNTER — Telehealth (INDEPENDENT_AMBULATORY_CARE_PROVIDER_SITE_OTHER): Payer: Medicare Other | Admitting: Family Medicine

## 2020-04-11 ENCOUNTER — Encounter: Payer: Self-pay | Admitting: Family Medicine

## 2020-04-11 DIAGNOSIS — R059 Cough, unspecified: Secondary | ICD-10-CM

## 2020-04-11 DIAGNOSIS — R05 Cough: Secondary | ICD-10-CM | POA: Diagnosis not present
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
## 2020-04-11 NOTE — Assessment & Plan Note (Signed)
Continues to have cough and persistent fever.  She will stop in this afternoon for COVID testing.  Recommend continued supportive care.  If COVID testing negative I think the next step would be to check CXR and CBC.

## 2020-04-11 NOTE — Progress Notes (Signed)
Experiencing similar symptoms from when she saw [facility] on 04/04/20.   She is not being allowed into places due to the low grade fever.

## 2020-04-12 ENCOUNTER — Other Ambulatory Visit: Payer: Self-pay | Admitting: Family Medicine

## 2020-04-12 DIAGNOSIS — R509 Fever, unspecified: Secondary | ICD-10-CM

## 2020-04-12 DIAGNOSIS — R059 Cough, unspecified: Secondary | ICD-10-CM

## 2020-04-12 DIAGNOSIS — R05 Cough: Secondary | ICD-10-CM

## 2020-04-12 LAB — SARS-COV-2, NAA 2 DAY TAT

## 2020-04-12 LAB — NOVEL CORONAVIRUS, NAA: SARS-CoV-2, NAA: NOT DETECTED

## 2020-04-16 ENCOUNTER — Ambulatory Visit (INDEPENDENT_AMBULATORY_CARE_PROVIDER_SITE_OTHER): Payer: Medicare Other

## 2020-04-16 ENCOUNTER — Other Ambulatory Visit: Payer: Self-pay

## 2020-04-16 DIAGNOSIS — R05 Cough: Secondary | ICD-10-CM | POA: Diagnosis not present

## 2020-04-16 DIAGNOSIS — R509 Fever, unspecified: Secondary | ICD-10-CM

## 2020-04-16 DIAGNOSIS — R059 Cough, unspecified: Secondary | ICD-10-CM

## 2020-04-16 LAB — CBC WITH DIFFERENTIAL/PLATELET
Absolute Monocytes: 537 cells/uL (ref 200–950)
Basophils Absolute: 63 cells/uL (ref 0–200)
Basophils Relative: 0.8 %
Eosinophils Absolute: 371 cells/uL (ref 15–500)
Eosinophils Relative: 4.7 %
HCT: 41.4 % (ref 35.0–45.0)
Hemoglobin: 13.6 g/dL (ref 11.7–15.5)
Lymphs Abs: 3081 cells/uL (ref 850–3900)
MCH: 30.4 pg (ref 27.0–33.0)
MCHC: 32.9 g/dL (ref 32.0–36.0)
MCV: 92.6 fL (ref 80.0–100.0)
MPV: 12.3 fL (ref 7.5–12.5)
Monocytes Relative: 6.8 %
Neutro Abs: 3847 cells/uL (ref 1500–7800)
Neutrophils Relative %: 48.7 %
Platelets: 308 10*3/uL (ref 140–400)
RBC: 4.47 10*6/uL (ref 3.80–5.10)
RDW: 12.8 % (ref 11.0–15.0)
Total Lymphocyte: 39 %
WBC: 7.9 10*3/uL (ref 3.8–10.8)

## 2020-05-06 ENCOUNTER — Telehealth: Payer: Self-pay | Admitting: Physician Assistant

## 2020-05-06 NOTE — Telephone Encounter (Signed)
sure

## 2020-05-06 NOTE — Telephone Encounter (Signed)
Appointment has been changed. No further questions at this time.  

## 2020-05-06 NOTE — Telephone Encounter (Signed)
Patient wants to know if she can change her appointment to a virtual.

## 2020-05-09 DIAGNOSIS — F3181 Bipolar II disorder: Secondary | ICD-10-CM | POA: Diagnosis not present

## 2020-05-09 DIAGNOSIS — F4310 Post-traumatic stress disorder, unspecified: Secondary | ICD-10-CM | POA: Diagnosis not present

## 2020-05-13 ENCOUNTER — Encounter: Payer: Self-pay | Admitting: Physician Assistant

## 2020-05-14 ENCOUNTER — Encounter: Payer: Self-pay | Admitting: Physician Assistant

## 2020-05-14 ENCOUNTER — Telehealth (INDEPENDENT_AMBULATORY_CARE_PROVIDER_SITE_OTHER): Payer: Medicare Other | Admitting: Physician Assistant

## 2020-05-14 ENCOUNTER — Other Ambulatory Visit: Payer: Self-pay

## 2020-05-14 DIAGNOSIS — R059 Cough, unspecified: Secondary | ICD-10-CM

## 2020-05-14 DIAGNOSIS — M2550 Pain in unspecified joint: Secondary | ICD-10-CM | POA: Diagnosis not present

## 2020-05-14 DIAGNOSIS — M5030 Other cervical disc degeneration, unspecified cervical region: Secondary | ICD-10-CM | POA: Diagnosis not present

## 2020-05-14 DIAGNOSIS — Z0289 Encounter for other administrative examinations: Secondary | ICD-10-CM

## 2020-05-14 DIAGNOSIS — R062 Wheezing: Secondary | ICD-10-CM

## 2020-05-14 DIAGNOSIS — M797 Fibromyalgia: Secondary | ICD-10-CM

## 2020-05-14 DIAGNOSIS — G894 Chronic pain syndrome: Secondary | ICD-10-CM

## 2020-05-14 DIAGNOSIS — R0600 Dyspnea, unspecified: Secondary | ICD-10-CM

## 2020-05-14 DIAGNOSIS — E782 Mixed hyperlipidemia: Secondary | ICD-10-CM

## 2020-05-14 DIAGNOSIS — K2100 Gastro-esophageal reflux disease with esophagitis, without bleeding: Secondary | ICD-10-CM

## 2020-05-14 MED ORDER — MONTELUKAST SODIUM 10 MG PO TABS: 10.0000 mg | ORAL_TABLET | Freq: Every day | ORAL | 3 refills | Status: DC

## 2020-05-14 MED ORDER — ALBUTEROL SULFATE HFA 108 (90 BASE) MCG/ACT IN AERS: 2.0000 | INHALATION_SPRAY | Freq: Four times a day (QID) | RESPIRATORY_TRACT | 0 refills | Status: DC | PRN

## 2020-05-14 MED ORDER — PANTOPRAZOLE SODIUM 40 MG PO TBEC: 40.0000 mg | DELAYED_RELEASE_TABLET | Freq: Two times a day (BID) | ORAL | 1 refills | Status: DC

## 2020-05-14 MED ORDER — HYDROCODONE-ACETAMINOPHEN 10-325 MG PO TABS: 1.0000 | ORAL_TABLET | Freq: Two times a day (BID) | ORAL | 0 refills | Status: DC | PRN

## 2020-05-14 MED ORDER — TIZANIDINE HCL 4 MG PO CAPS: 4.0000 mg | ORAL_CAPSULE | Freq: Every day | ORAL | 2 refills | Status: DC

## 2020-05-14 MED ORDER — GABAPENTIN 600 MG PO TABS: 600.0000 mg | ORAL_TABLET | Freq: Three times a day (TID) | ORAL | 1 refills | Status: DC

## 2020-05-14 MED ORDER — ATORVASTATIN CALCIUM 20 MG PO TABS: 20.0000 mg | ORAL_TABLET | Freq: Every day | ORAL | 3 refills | Status: DC

## 2020-05-14 NOTE — Progress Notes (Signed)
Message from [place] from patient:  "Debbie Hancock.. my psychiatrist is taking over all medications for my mental health... Can you please call in 90 day  refills for  gabapantin ,, atavastorin and pantaprozil to [facility].Marland Kitchenalso i am out of refills for hydrocodone pain meds.. I am still weazing and will need an inhaler...can please refer me to a gyneocologist .. i have been very fatigued.. not getting enough sleep due to  my chronic pain... i am wondering if a sleep study would help me find out about why i am not getting enough sleep.Marland KitchenMarland KitchenI pray you are doing well from your surgery.. thank you"  Pain contract up to date. Colonoscopy scheduled for next month. She wanted to discuss removing metal in her foot they found on an xray?

## 2020-05-14 NOTE — Progress Notes (Signed)
Patient called and scheduled.
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
## 2020-05-17 ENCOUNTER — Telehealth: Payer: Self-pay | Admitting: Physician Assistant

## 2020-05-17 NOTE — Telephone Encounter (Signed)
Patient advised that the muscle relaxers prescribed on the last appointment have not been effective.

## 2020-05-21 NOTE — Telephone Encounter (Signed)
Over the weekend did you have any improvement in pain?

## 2020-05-21 NOTE — Telephone Encounter (Signed)
Pt states that she did not have any improvement over the weekend and that it done "nothing".  Her feet and legs are "killing her".  She states that she took a 2 day break from walking.  Please advise.  Tiajuana Amass, CMA

## 2020-05-22 NOTE — Telephone Encounter (Signed)
You are taking norco, gabapentin, muscle relaxers. Have you tried rubbing CBD oil on them? Would you like to go to pain clinic?

## 2020-05-22 NOTE — Telephone Encounter (Signed)
Spoke with patient. She used some [place] wraps that helped. She will call back if needed.

## 2020-05-24 ENCOUNTER — Telehealth: Payer: Medicare Other | Admitting: Physician Assistant

## 2020-05-31 ENCOUNTER — Ambulatory Visit (INDEPENDENT_AMBULATORY_CARE_PROVIDER_SITE_OTHER): Payer: Medicare Other | Admitting: Physician Assistant

## 2020-05-31 VITALS — BP 117/79 | HR 93 | Ht 63.0 in | Wt 172.0 lb

## 2020-05-31 DIAGNOSIS — M79672 Pain in left foot: Secondary | ICD-10-CM

## 2020-05-31 DIAGNOSIS — M79671 Pain in right foot: Secondary | ICD-10-CM | POA: Diagnosis not present

## 2020-05-31 DIAGNOSIS — R0602 Shortness of breath: Secondary | ICD-10-CM

## 2020-05-31 DIAGNOSIS — R062 Wheezing: Secondary | ICD-10-CM | POA: Diagnosis not present

## 2020-05-31 MED ORDER — DICLOFENAC SODIUM 75 MG PO TBEC: 75.0000 mg | DELAYED_RELEASE_TABLET | Freq: Two times a day (BID) | ORAL | 2 refills | Status: DC

## 2020-05-31 MED ORDER — ALBUTEROL SULFATE HFA 108 (90 BASE) MCG/ACT IN AERS
2.0000 | INHALATION_SPRAY | Freq: Once | RESPIRATORY_TRACT | Status: AC
Start: 2020-05-31 — End: 2020-05-31
  Administered 2020-05-31: 2 via RESPIRATORY_TRACT

## 2020-05-31 NOTE — Patient Instructions (Addendum)
Will refer to podiatry.    Plantar Fasciitis  Plantar fasciitis is a painful foot condition that affects the heel. It occurs when the band of tissue that connects the toes to the heel bone (plantar fascia) becomes irritated. This can happen as the result of exercising too much or doing other repetitive activities (overuse injury). The pain from plantar fasciitis can range from mild irritation to severe pain that makes it difficult to walk or move. The pain is usually worse in the morning after sleeping, or after sitting or lying down for a while. Pain may also be worse after long periods of walking or standing. What are the causes? This condition may be caused by:  Standing for long periods of time.  Wearing shoes that do not have good arch support.  Doing activities that put stress on joints (high-impact activities), including running, aerobics, and ballet.  Being overweight.  An abnormal way of walking (gait).  Tight muscles in the back of your lower leg (calf).  High arches in your feet.  Starting a new athletic activity. What are the signs or symptoms? The main symptom of this condition is heel pain. Pain may:  Be worse with first steps after a time of rest, especially in the morning after sleeping or after you have been sitting or lying down for a while.  Be worse after long periods of standing still.  Decrease after 30-45 minutes of activity, such as gentle walking. How is this diagnosed? This condition may be diagnosed based on your medical history and your symptoms. Your health care provider may ask questions about your activity level. Your health care provider will do a physical exam to check for:  A tender area on the bottom of your foot.  A high arch in your foot.  Pain when you move your foot.  Difficulty moving your foot. You may have imaging tests to confirm the diagnosis, such as:  X-rays.  Ultrasound.  MRI. How is this treated? Treatment for plantar  fasciitis depends on how severe your condition is. Treatment may include:  Rest, ice, applying pressure (compression), and raising the affected foot (elevation). This may be called RICE therapy. Your health care provider may recommend RICE therapy along with over-the-counter pain medicines to manage your pain.  Exercises to stretch your calves and your plantar fascia.  A splint that holds your foot in a stretched, upward position while you sleep (night splint).  Physical therapy to relieve symptoms and prevent problems in the future.  Injections of steroid medicine (cortisone) to relieve pain and inflammation.  Stimulating your plantar fascia with electrical impulses (extracorporeal shock wave therapy). This is usually the last treatment option before surgery.  Surgery, if other treatments have not worked after 12 months. Follow these instructions at home:  Managing pain, stiffness, and swelling  If directed, put ice on the painful area: ? Put ice in a plastic bag, or use a frozen bottle of water. ? Place a towel between your skin and the bag or bottle. ? Roll the bottom of your foot over the bag or bottle. ? Do this for 20 minutes, 2-3 times a day.  Wear athletic shoes that have air-sole or gel-sole cushions, or try wearing soft shoe inserts that are designed for plantar fasciitis.  Raise (elevate) your foot above the level of your heart while you are sitting or lying down. Activity  Avoid activities that cause pain. Ask your health care provider what activities are safe for you.  Do  physical therapy exercises and stretches as told by your health care provider.  Try activities and forms of exercise that are easier on your joints (low-impact). Examples include swimming, water aerobics, and biking. General instructions  Take over-the-counter and prescription medicines only as told by your health care provider.  Wear a night splint while sleeping, if told by your health care  provider. Loosen the splint if your toes tingle, become numb, or turn cold and blue.  Maintain a healthy weight, or work with your health care provider to lose weight as needed.  Keep all follow-up visits as told by your health care provider. This is important. Contact a health care provider if you:  Have symptoms that do not go away after caring for yourself at home.  Have pain that gets worse.  Have pain that affects your ability to move or do your daily activities. Summary  Plantar fasciitis is a painful foot condition that affects the heel. It occurs when the band of tissue that connects the toes to the heel bone (plantar fascia) becomes irritated.  The main symptom of this condition is heel pain that may be worse after exercising too much or standing still for a long time.  Treatment varies, but it usually starts with rest, ice, compression, and elevation (RICE therapy) and over-the-counter medicines to manage pain. This information is not intended to replace advice given to you by your health care provider. Make sure you discuss any questions you have with your health care provider. Document Revised: 11/12/2017 Document Reviewed: 09/27/2017 Elsevier Patient Education  2020 [employer].
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
## 2020-05-31 NOTE — Progress Notes (Signed)
Pt here for spirometry testing.   She gave great effort while performing test..

## 2020-06-04 NOTE — Addendum Note (Signed)
Addended by: Chalmers Cater on: 06/04/2020 08:35 AM   Modules accepted: Orders

## 2020-06-12 DIAGNOSIS — K635 Polyp of colon: Secondary | ICD-10-CM | POA: Insufficient documentation

## 2020-07-23 DIAGNOSIS — F603 Borderline personality disorder: Secondary | ICD-10-CM | POA: Diagnosis not present

## 2020-08-14 ENCOUNTER — Encounter: Payer: Self-pay | Admitting: Physician Assistant

## 2020-08-14 ENCOUNTER — Ambulatory Visit (INDEPENDENT_AMBULATORY_CARE_PROVIDER_SITE_OTHER): Payer: Medicare HMO | Admitting: Physician Assistant

## 2020-08-14 ENCOUNTER — Other Ambulatory Visit: Payer: Self-pay | Admitting: Physician Assistant

## 2020-08-14 VITALS — BP 126/86 | HR 98 | Ht 63.0 in | Wt 172.0 lb

## 2020-08-14 DIAGNOSIS — M5030 Other cervical disc degeneration, unspecified cervical region: Secondary | ICD-10-CM | POA: Diagnosis not present

## 2020-08-14 DIAGNOSIS — Z79899 Other long term (current) drug therapy: Secondary | ICD-10-CM

## 2020-08-14 DIAGNOSIS — M2550 Pain in unspecified joint: Secondary | ICD-10-CM

## 2020-08-14 DIAGNOSIS — K2960 Other gastritis without bleeding: Secondary | ICD-10-CM | POA: Diagnosis not present

## 2020-08-14 DIAGNOSIS — Z0289 Encounter for other administrative examinations: Secondary | ICD-10-CM

## 2020-08-14 DIAGNOSIS — Z23 Encounter for immunization: Secondary | ICD-10-CM | POA: Diagnosis not present

## 2020-08-14 DIAGNOSIS — G894 Chronic pain syndrome: Secondary | ICD-10-CM

## 2020-08-14 DIAGNOSIS — M797 Fibromyalgia: Secondary | ICD-10-CM | POA: Diagnosis not present

## 2020-08-14 DIAGNOSIS — S0300XA Dislocation of jaw, unspecified side, initial encounter: Secondary | ICD-10-CM

## 2020-08-14 DIAGNOSIS — T148XXA Other injury of unspecified body region, initial encounter: Secondary | ICD-10-CM

## 2020-08-14 DIAGNOSIS — M79671 Pain in right foot: Secondary | ICD-10-CM | POA: Diagnosis not present

## 2020-08-14 DIAGNOSIS — N3281 Overactive bladder: Secondary | ICD-10-CM | POA: Diagnosis not present

## 2020-08-14 DIAGNOSIS — E782 Mixed hyperlipidemia: Secondary | ICD-10-CM | POA: Diagnosis not present

## 2020-08-14 DIAGNOSIS — M79672 Pain in left foot: Secondary | ICD-10-CM

## 2020-08-14 DIAGNOSIS — Z1231 Encounter for screening mammogram for malignant neoplasm of breast: Secondary | ICD-10-CM

## 2020-08-14 DIAGNOSIS — M6289 Other specified disorders of muscle: Secondary | ICD-10-CM

## 2020-08-14 MED ORDER — DOXYCYCLINE HYCLATE 100 MG PO TABS: 100.0000 mg | ORAL_TABLET | Freq: Two times a day (BID) | ORAL | 0 refills | Status: DC

## 2020-08-14 MED ORDER — OMEPRAZOLE 40 MG PO CPDR: 40.0000 mg | DELAYED_RELEASE_CAPSULE | Freq: Every day | ORAL | 3 refills | Status: DC

## 2020-08-14 MED ORDER — BACLOFEN 10 MG PO TABS: 10.0000 mg | ORAL_TABLET | Freq: Every day | ORAL | 1 refills | Status: DC

## 2020-08-14 MED ORDER — HYDROCODONE-ACETAMINOPHEN 10-325 MG PO TABS: 1.0000 | ORAL_TABLET | Freq: Two times a day (BID) | ORAL | 0 refills | Status: DC | PRN

## 2020-08-14 MED ORDER — NORTRIPTYLINE HCL 25 MG PO CAPS: 25.0000 mg | ORAL_CAPSULE | Freq: Two times a day (BID) | ORAL | 3 refills | Status: DC

## 2020-08-14 MED ORDER — DICLOFENAC SODIUM 75 MG PO TBEC: 75.0000 mg | DELAYED_RELEASE_TABLET | Freq: Two times a day (BID) | ORAL | 3 refills | Status: DC
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
## 2020-08-15 ENCOUNTER — Encounter: Payer: Self-pay | Admitting: Physician Assistant

## 2020-08-15 DIAGNOSIS — M6289 Other specified disorders of muscle: Secondary | ICD-10-CM | POA: Insufficient documentation

## 2020-08-15 DIAGNOSIS — N3281 Overactive bladder: Secondary | ICD-10-CM | POA: Insufficient documentation

## 2020-08-15 DIAGNOSIS — R748 Abnormal levels of other serum enzymes: Secondary | ICD-10-CM | POA: Insufficient documentation

## 2020-08-15 DIAGNOSIS — S0300XA Dislocation of jaw, unspecified side, initial encounter: Secondary | ICD-10-CM | POA: Insufficient documentation

## 2020-08-15 DIAGNOSIS — K2960 Other gastritis without bleeding: Secondary | ICD-10-CM | POA: Insufficient documentation

## 2020-08-15 DIAGNOSIS — M79671 Pain in right foot: Secondary | ICD-10-CM | POA: Insufficient documentation

## 2020-08-15 DIAGNOSIS — T148XXA Other injury of unspecified body region, initial encounter: Secondary | ICD-10-CM | POA: Insufficient documentation

## 2020-08-15 DIAGNOSIS — E781 Pure hyperglyceridemia: Secondary | ICD-10-CM | POA: Insufficient documentation

## 2020-08-15 LAB — CBC WITH DIFFERENTIAL/PLATELET
Absolute Monocytes: 435 cells/uL (ref 200–950)
Basophils Absolute: 60 cells/uL (ref 0–200)
Basophils Relative: 0.8 %
Eosinophils Absolute: 315 cells/uL (ref 15–500)
Eosinophils Relative: 4.2 %
HCT: 39.7 % (ref 35.0–45.0)
Hemoglobin: 13.3 g/dL (ref 11.7–15.5)
Lymphs Abs: 2235 cells/uL (ref 850–3900)
MCH: 30.6 pg (ref 27.0–33.0)
MCHC: 33.5 g/dL (ref 32.0–36.0)
MCV: 91.3 fL (ref 80.0–100.0)
MPV: 11.9 fL (ref 7.5–12.5)
Monocytes Relative: 5.8 %
Neutro Abs: 4455 cells/uL (ref 1500–7800)
Neutrophils Relative %: 59.4 %
Platelets: 298 10*3/uL (ref 140–400)
RBC: 4.35 10*6/uL (ref 3.80–5.10)
RDW: 12.5 % (ref 11.0–15.0)
Total Lymphocyte: 29.8 %
WBC: 7.5 10*3/uL (ref 3.8–10.8)

## 2020-08-15 LAB — COMPLETE METABOLIC PANEL WITH GFR
AG Ratio: 1.3 (calc) (ref 1.0–2.5)
ALT: 90 U/L — ABNORMAL HIGH (ref 6–29)
AST: 45 U/L — ABNORMAL HIGH (ref 10–35)
Albumin: 4.1 g/dL (ref 3.6–5.1)
Alkaline phosphatase (APISO): 122 U/L (ref 37–153)
BUN: 12 mg/dL (ref 7–25)
CO2: 29 mmol/L (ref 20–32)
Calcium: 9.5 mg/dL (ref 8.6–10.4)
Chloride: 101 mmol/L (ref 98–110)
Creat: 0.75 mg/dL (ref 0.50–1.05)
GFR, Est African American: 104 mL/min/{1.73_m2} (ref >=60)
GFR, Est Non African American: 90 mL/min/{1.73_m2} (ref >=60)
Globulin: 3.1 g/dL (calc) (ref 1.9–3.7)
Glucose, Bld: 91 mg/dL (ref 65–99)
Potassium: 4.7 mmol/L (ref 3.5–5.3)
Sodium: 138 mmol/L (ref 135–146)
Total Bilirubin: 0.5 mg/dL (ref 0.2–1.2)
Total Protein: 7.2 g/dL (ref 6.1–8.1)

## 2020-08-15 LAB — LIPID PANEL W/REFLEX DIRECT LDL
Cholesterol: 205 mg/dL — ABNORMAL HIGH (ref <200)
HDL: 58 mg/dL (ref >=50)
LDL Cholesterol (Calc): 105 mg/dL (calc) — ABNORMAL HIGH
Non-HDL Cholesterol (Calc): 147 mg/dL (calc) — ABNORMAL HIGH (ref <130)
Total CHOL/HDL Ratio: 3.5 (calc) (ref <5.0)
Triglycerides: 313 mg/dL — ABNORMAL HIGH (ref <150)

## 2020-08-15 NOTE — Progress Notes (Signed)
Javon,   You TG are still rising. HDL good. LDL better! I would like to start you on triglyceride medication. Are you ok with this? Your liver enzymes are up. Are you drinking alcohol more frequently? I know you are taking norco daily. Stop any alcohol and then recheck in 2 weeks.

## 2020-08-16 ENCOUNTER — Encounter: Payer: Self-pay | Admitting: Physician Assistant

## 2020-08-20 ENCOUNTER — Other Ambulatory Visit: Payer: Self-pay | Admitting: Neurology

## 2020-08-20 DIAGNOSIS — R748 Abnormal levels of other serum enzymes: Secondary | ICD-10-CM

## 2020-08-20 NOTE — Progress Notes (Signed)
Repeat labs in 2 weeks. If still elevated then will get more work up done. Norco and other medications you are on as well as weight gain can also cause liver enzymes to elevate. This is a very small elevation at this time which is reassuring.

## 2020-08-21 ENCOUNTER — Ambulatory Visit: Payer: Medicare HMO

## 2020-09-03 DIAGNOSIS — F603 Borderline personality disorder: Secondary | ICD-10-CM | POA: Diagnosis not present

## 2020-09-04 ENCOUNTER — Other Ambulatory Visit: Payer: Self-pay

## 2020-09-04 ENCOUNTER — Ambulatory Visit (INDEPENDENT_AMBULATORY_CARE_PROVIDER_SITE_OTHER): Payer: Medicare HMO

## 2020-09-04 DIAGNOSIS — Z1231 Encounter for screening mammogram for malignant neoplasm of breast: Secondary | ICD-10-CM

## 2020-09-05 NOTE — Progress Notes (Signed)
Normal mammogram. Follow up in 1 year.

## 2020-11-05 ENCOUNTER — Telehealth: Payer: Medicare HMO | Admitting: Physician Assistant

## 2020-11-05 DIAGNOSIS — Z760 Encounter for issue of repeat prescription: Secondary | ICD-10-CM

## 2020-11-05 DIAGNOSIS — R059 Cough, unspecified: Secondary | ICD-10-CM | POA: Diagnosis not present

## 2020-11-05 DIAGNOSIS — R062 Wheezing: Secondary | ICD-10-CM | POA: Diagnosis not present

## 2020-11-05 MED ORDER — ALBUTEROL SULFATE HFA 108 (90 BASE) MCG/ACT IN AERS: 2.0000 | INHALATION_SPRAY | Freq: Four times a day (QID) | RESPIRATORY_TRACT | 0 refills | Status: DC | PRN

## 2020-11-05 MED ORDER — FLUTICASONE PROPIONATE 50 MCG/ACT NA SUSP: 2.0000 | Freq: Every day | NASAL | 0 refills | Status: DC

## 2020-11-05 NOTE — Progress Notes (Signed)
E visit for Allergic Rhinitis We are sorry that you are not feeling well.  Here is how we plan to help!  Unfortunately, through this portal you cannot see Tandy Gaw, but I will be happy to refill your flonase and inhaler until you are able to schedule an appointment with her. Your prescriptions will be sent electronically to your preferred pharmacy.  Based on what you have shared with me it looks like you have Allergic Rhinitis.  Rhinitis is when a reaction occurs that causes nasal congestion, runny nose, sneezing, and itching.  Most types of rhinitis are caused by an inflammation and are associated with symptoms in the eyes ears or throat. There are several types of rhinitis.  The most common are acute rhinitis, which is usually caused by a viral illness, allergic or seasonal rhinitis, and nonallergic or year-round rhinitis.  Nasal allergies occur certain times of the year.  Allergic rhinitis is caused when allergens in the air trigger the release of histamine in the body.  Histamine causes itching, swelling, and fluid to build up in the fragile linings of the nasal passages, sinuses and eyelids.  An itchy nose and clear discharge are common.  HOME CARE:   You can use an over-the-counter saline nasal spray as needed  Avoid areas where there is heavy dust, mites, or molds  Stay indoors on windy days during the pollen season  Keep windows closed in home, at least in bedroom; use air conditioner.  Use high-efficiency house air filter  Keep windows closed in car, turn AC on re-circulate  Avoid playing out with dog during pollen season  GET HELP RIGHT AWAY IF:   If your symptoms do not improve within 10 days  You become short of breath  You develop yellow or green discharge from your nose for over 3 days  You have coughing fits  MAKE SURE YOU:   Understand these instructions  Will watch your condition  Will get help right away if you are not doing well or get worse  Thank  you for choosing an e-visit. Your e-visit answers were reviewed by a board certified advanced clinical practitioner to complete your personal care plan. Depending upon the condition, your plan could have included both over the counter or prescription medications. Please review your pharmacy choice. Be sure that the pharmacy you have chosen is open so that you can pick up your prescription now.  If there is a problem you may message your provider in MyChart to have the prescription routed to another pharmacy. Your safety is important to [place]. If you have drug allergies check your prescription carefully.  For the next 24 hours, you can use MyChart to ask questions about today's visit, request a non-urgent call back, or ask for a work or school excuse from your e-visit provider. You will get an email in the next two days asking about your experience. I hope that your e-visit has been valuable and will speed your recovery.    Greater than 5 minutes, yet less than 10 minutes of time have been spent researching, coordinating, and implementing care for this patient today

## 2020-11-07 ENCOUNTER — Other Ambulatory Visit: Payer: Self-pay | Admitting: Physician Assistant

## 2020-11-07 DIAGNOSIS — M797 Fibromyalgia: Secondary | ICD-10-CM

## 2020-11-07 DIAGNOSIS — G894 Chronic pain syndrome: Secondary | ICD-10-CM

## 2020-11-13 ENCOUNTER — Telehealth (INDEPENDENT_AMBULATORY_CARE_PROVIDER_SITE_OTHER): Payer: Medicare HMO | Admitting: Physician Assistant

## 2020-11-13 VITALS — BP 106/80 | Temp 98.7°F | Ht 63.0 in | Wt 164.0 lb

## 2020-11-13 DIAGNOSIS — M2550 Pain in unspecified joint: Secondary | ICD-10-CM

## 2020-11-13 DIAGNOSIS — M5030 Other cervical disc degeneration, unspecified cervical region: Secondary | ICD-10-CM | POA: Diagnosis not present

## 2020-11-13 DIAGNOSIS — L719 Rosacea, unspecified: Secondary | ICD-10-CM | POA: Insufficient documentation

## 2020-11-13 DIAGNOSIS — G894 Chronic pain syndrome: Secondary | ICD-10-CM

## 2020-11-13 DIAGNOSIS — M79671 Pain in right foot: Secondary | ICD-10-CM | POA: Diagnosis not present

## 2020-11-13 DIAGNOSIS — R252 Cramp and spasm: Secondary | ICD-10-CM | POA: Diagnosis not present

## 2020-11-13 DIAGNOSIS — M79672 Pain in left foot: Secondary | ICD-10-CM

## 2020-11-13 DIAGNOSIS — M797 Fibromyalgia: Secondary | ICD-10-CM | POA: Diagnosis not present

## 2020-11-13 DIAGNOSIS — Z0289 Encounter for other administrative examinations: Secondary | ICD-10-CM

## 2020-11-13 DIAGNOSIS — S0300XA Dislocation of jaw, unspecified side, initial encounter: Secondary | ICD-10-CM

## 2020-11-13 MED ORDER — GABAPENTIN 600 MG PO TABS: 600.0000 mg | ORAL_TABLET | Freq: Three times a day (TID) | ORAL | 1 refills | Status: DC

## 2020-11-13 MED ORDER — HYDROCODONE-ACETAMINOPHEN 10-325 MG PO TABS: 1.0000 | ORAL_TABLET | Freq: Two times a day (BID) | ORAL | 0 refills | Status: DC | PRN

## 2020-11-13 MED ORDER — DICLOFENAC SODIUM 1 % EX GEL: 4.0000 g | Freq: Four times a day (QID) | CUTANEOUS | 2 refills | Status: DC

## 2020-11-13 MED ORDER — FLUTICASONE PROPIONATE 50 MCG/ACT NA SUSP: 2.0000 | Freq: Every day | NASAL | 3 refills | Status: DC

## 2020-11-13 MED ORDER — BACLOFEN 10 MG PO TABS: 10.0000 mg | ORAL_TABLET | Freq: Two times a day (BID) | ORAL | 1 refills | Status: DC

## 2020-11-13 MED ORDER — METRONIDAZOLE 1 % EX GEL: Freq: Every day | CUTANEOUS | 2 refills | Status: DC

## 2020-11-13 NOTE — Progress Notes (Signed)
Tizanidine and Baclofen - she is questioning if she needs both, states baclofen works better  Needs pain medicine refilled - contract UTD March 2021
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
## 2020-11-13 NOTE — Patient Instructions (Signed)
Plantar Fasciitis Rehab Ask your health care provider which exercises are safe for you. Do exercises exactly as told by your health care provider and adjust them as directed. It is normal to feel mild stretching, pulling, tightness, or discomfort as you do these exercises. Stop right away if you feel sudden pain or your pain gets worse. Do not begin these exercises until told by your health care provider. Stretching and range-of-motion exercises These exercises warm up your muscles and joints and improve the movement and flexibility of your foot. These exercises also help to relieve pain. Plantar fascia stretch  1. Sit with your left / right leg crossed over your opposite knee. 2. Hold your heel with one hand with that thumb near your arch. With your other hand, hold your toes and gently pull them back toward the top of your foot. You should feel a stretch on the bottom of your toes or your foot (plantar fascia) or both. 3. Hold this stretch for__________ seconds. 4. Slowly release your toes and return to the starting position. Repeat __________ times. Complete this exercise __________ times a day. Gastrocnemius stretch, standing This exercise is also called a calf (gastroc) stretch. It stretches the muscles in the back of the upper calf. 1. Stand with your hands against a wall. 2. Extend your left / right leg behind you, and bend your front knee slightly. 3. Keeping your heels on the floor and your back knee straight, shift your weight toward the wall. Do not arch your back. You should feel a gentle stretch in your upper left / right calf. 4. Hold this position for __________ seconds. Repeat __________ times. Complete this exercise __________ times a day. Soleus stretch, standing This exercise is also called a calf (soleus) stretch. It stretches the muscles in the back of the lower calf. 1. Stand with your hands against a wall. 2. Extend your left / right leg behind you, and bend your front  knee slightly. 3. Keeping your heels on the floor, bend your back knee and shift your weight slightly over your back leg. You should feel a gentle stretch deep in your lower calf. 4. Hold this position for __________ seconds. Repeat __________ times. Complete this exercise __________ times a day. Gastroc and soleus stretch, standing step This exercise stretches the muscles in the back of the lower leg. These muscles are in the upper calf (gastrocnemius) and the lower calf (soleus). 1. Stand with the ball of your left / right foot on a step. The ball of your foot is on the walking surface, right under your toes. 2. Keep your other foot firmly on the same step. 3. Hold on to the wall or a railing for balance. 4. Slowly lift your other foot, allowing your body weight to press your left / right heel down over the edge of the step. You should feel a stretch in your left / right calf. 5. Hold this position for __________ seconds. 6. Return both feet to the step. 7. Repeat this exercise with a slight bend in your left / right knee. Repeat __________ times with your left / right knee straight and __________ times with your left / right knee bent. Complete this exercise __________ times a day. Balance exercise This exercise builds your balance and strength control of your arch to help take pressure off your plantar fascia. Single leg stand If this exercise is too easy, you can try it with your eyes closed or while standing on a pillow. 1.   Without shoes, stand near a railing or in a doorway. You may hold on to the railing or door frame as needed. 2. Stand on your left / right foot. Keep your big toe down on the floor and try to keep your arch lifted. Do not let your foot roll inward. 3. Hold this position for __________ seconds. Repeat __________ times. Complete this exercise __________ times a day. This information is not intended to replace advice given to you by your health care provider. Make sure  you discuss any questions you have with your health care provider. Document Revised: 03/23/2019 Document Reviewed: 09/28/2018 Elsevier Patient Education  2020 [employer] Rosacea Rosacea is a long-term (chronic) condition that affects the skin of the face, including the cheeks, nose, forehead, and chin. This condition can also affect the eyes. Rosacea causes blood vessels near the surface of the skin to enlarge, which results in redness. What are the causes? The cause of this condition is not known. Certain triggers can make rosacea worse, including:  Hot baths.  Exercise.  Sunlight.  Very hot or cold temperatures.  Hot or spicy foods and drinks.  Drinking alcohol.  Stress.  Taking blood pressure medicine.  Long-term use of topical steroids on the face. What increases the risk? You are more likely to develop this condition if you:  Are older than 56 years of age.  Are a woman.  Have light-colored skin (light complexion).  Have a family history of rosacea. What are the signs or symptoms? Symptoms of this condition include:  Redness of the face.  Red bumps or pimples on the face.  A red, enlarged nose.  Blushing easily.  Red lines on the skin.  Irritated, burning, or itchy feeling in the eyes.  Swollen eyelids.  Drainage from the eyes.  Feeling like there is something in your eye. How is this diagnosed? This condition is diagnosed with a medical history and physical exam. How is this treated? There is no cure for this condition, but treatment can help to control your symptoms. Your health care provider may recommend that you see a skin specialist (dermatologist). Treatment may include:  Medicines that are applied to the skin or taken by mouth (orally). This can include antibiotic medicines.  Laser treatment to improve the appearance of the skin.  Surgery. This is rare. Your health care provider will also recommend the best way to take care of your  skin. Even after your skin improves, you will likely need to continue treatment to prevent your rosacea from coming back. Follow these instructions at home: Skin care Take care of your skin as told by your health care provider. You may be told to do these things:  Wash your skin gently two or more times each day.  Use mild soap.  Use a sunscreen or sunblock with SPF 30 or greater.  Use gentle cosmetics that are meant for sensitive skin.  Shave with an electric shaver instead of a blade. Lifestyle  Try to keep track of what foods trigger this condition. Avoid any triggers. These may include: ? Spicy foods. ? Seafood. ? Cheese. ? Hot liquids. ? Nuts. ? Chocolate. ? Iodized salt.  Do not drink alcohol.  Avoid extremely cold or hot temperatures.  Try to reduce your stress. If you need help, talk with your health care provider.  When you exercise, do these things to stay cool: ? Limit sun exposure to your face. ? Use a fan. ? Do shorter and more frequent intervals  of exercise. General instructions  Take and apply over-the-counter and prescription medicines only as told by your health care provider.  If you were prescribed an antibiotic medicine, apply it or take it as told by your health care provider. Do not stop using the antibiotic even if your condition improves.  If your eyelids are affected, apply warm compresses to them. Do this as told by your health care provider.  Keep all follow-up visits as told by your health care provider. This is important. Contact a health care provider if:  Your symptoms get worse.  Your symptoms do not improve after 2 months of treatment.  You have new symptoms.  You have any changes in vision or you have problems with your eyes, such as redness or itching.  You feel depressed.  You lose your appetite.  You have trouble concentrating. Summary  Rosacea is a long-term (chronic) condition that affects the skin of the face,  including the cheeks, nose, forehead, and chin.  Take care of your skin as told by your health care provider.  Take and apply over-the-counter and prescription medicines only as told by your health care provider.  Contact a health care provider if your symptoms get worse or if you have any changes in vision or other problems with your eyes, such as redness or itching.  Keep all follow-up visits as told by your health care provider. This is important. This information is not intended to replace advice given to you by your health care provider. Make sure you discuss any questions you have with your health care provider. Document Revised: 05/04/2018 Document Reviewed: 05/04/2018 Elsevier Patient Education  2020 [employer].

## 2020-11-15 ENCOUNTER — Encounter: Payer: Self-pay | Admitting: Physician Assistant

## 2020-11-22 ENCOUNTER — Telehealth: Payer: Self-pay | Admitting: Physician Assistant

## 2020-11-22 NOTE — Telephone Encounter (Signed)
I called Vascular and had patient scheduled for her Vas [place] ABI With/WO TBI on 11/25/20 at 10:00 at [facility]. I went to call patient to let her know of scheduled appointment and realized she had cancelled the appointment and did not reschedule - CF

## 2020-11-25 ENCOUNTER — Ambulatory Visit (HOSPITAL_COMMUNITY): Payer: Medicare HMO

## 2020-12-15 ENCOUNTER — Other Ambulatory Visit: Payer: Self-pay | Admitting: Physician Assistant

## 2020-12-15 DIAGNOSIS — M79672 Pain in left foot: Secondary | ICD-10-CM

## 2020-12-15 DIAGNOSIS — M79671 Pain in right foot: Secondary | ICD-10-CM

## 2021-01-02 ENCOUNTER — Encounter: Payer: Self-pay | Admitting: Physician Assistant

## 2021-01-03 ENCOUNTER — Encounter: Payer: Self-pay | Admitting: Physician Assistant

## 2021-01-03 ENCOUNTER — Telehealth (INDEPENDENT_AMBULATORY_CARE_PROVIDER_SITE_OTHER): Payer: Medicare HMO | Admitting: Physician Assistant

## 2021-01-03 VITALS — BP 123/87 | Temp 97.5°F | Ht 63.0 in | Wt 158.0 lb

## 2021-01-03 DIAGNOSIS — R059 Cough, unspecified: Secondary | ICD-10-CM | POA: Diagnosis not present

## 2021-01-03 DIAGNOSIS — J329 Chronic sinusitis, unspecified: Secondary | ICD-10-CM | POA: Diagnosis not present

## 2021-01-03 DIAGNOSIS — Z20822 Contact with and (suspected) exposure to covid-19: Secondary | ICD-10-CM | POA: Diagnosis not present

## 2021-01-03 DIAGNOSIS — R0981 Nasal congestion: Secondary | ICD-10-CM

## 2021-01-03 DIAGNOSIS — J40 Bronchitis, not specified as acute or chronic: Secondary | ICD-10-CM | POA: Diagnosis not present

## 2021-01-03 DIAGNOSIS — R52 Pain, unspecified: Secondary | ICD-10-CM

## 2021-01-03 MED ORDER — AZITHROMYCIN 250 MG PO TABS: ORAL_TABLET | ORAL | 0 refills | Status: DC

## 2021-01-03 MED ORDER — DEXAMETHASONE 4 MG PO TABS: 4.0000 mg | ORAL_TABLET | Freq: Two times a day (BID) | ORAL | 0 refills | Status: DC
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
## 2021-01-03 NOTE — Progress Notes (Deleted)
Started 8 days ago  Body aches Cough (in the morning and night) Congestion in chest (taking Mucinex D) Dizziness (when coughing) Had a fall due to dizziness (hit right hip, did not hit head or lose consciousness, Last Thursday) Nausea (appetite gone, can still taste/smell) SOB (mostly at night and in the morning when coughing)

## 2021-01-06 DIAGNOSIS — R52 Pain, unspecified: Secondary | ICD-10-CM | POA: Diagnosis not present

## 2021-01-06 DIAGNOSIS — R0981 Nasal congestion: Secondary | ICD-10-CM | POA: Diagnosis not present

## 2021-01-06 DIAGNOSIS — R059 Cough, unspecified: Secondary | ICD-10-CM | POA: Diagnosis not present

## 2021-01-08 LAB — NOVEL CORONAVIRUS, NAA: SARS-CoV-2, NAA: DETECTED — AB

## 2021-01-08 LAB — SARS-COV-2, NAA 2 DAY TAT

## 2021-01-08 NOTE — Progress Notes (Signed)
Positive for covid. Quarantine for 10 days since symptomatic when talking. Continue symptomatic care. Call with any worsening or concerning symptoms.

## 2021-01-10 ENCOUNTER — Encounter (INDEPENDENT_AMBULATORY_CARE_PROVIDER_SITE_OTHER): Payer: Medicare HMO | Admitting: Physician Assistant

## 2021-01-10 NOTE — Progress Notes (Signed)
Since 12-26-2020 has not been able to sleep all night Really tired

## 2021-01-13 NOTE — Progress Notes (Signed)
Patient ID: Debbie Hancock, female   DOB: 29-May-1966, 55 y.o.   MRN: [id number]   Feeling better. No charge because called to cancel before visit.

## 2021-01-31 ENCOUNTER — Other Ambulatory Visit: Payer: Self-pay | Admitting: Physician Assistant

## 2021-01-31 DIAGNOSIS — R059 Cough, unspecified: Secondary | ICD-10-CM

## 2021-01-31 DIAGNOSIS — Z8616 Personal history of COVID-19: Secondary | ICD-10-CM

## 2021-01-31 DIAGNOSIS — R5383 Other fatigue: Secondary | ICD-10-CM

## 2021-02-03 ENCOUNTER — Ambulatory Visit (INDEPENDENT_AMBULATORY_CARE_PROVIDER_SITE_OTHER): Payer: Medicare HMO

## 2021-02-03 ENCOUNTER — Other Ambulatory Visit: Payer: Self-pay

## 2021-02-03 DIAGNOSIS — R059 Cough, unspecified: Secondary | ICD-10-CM

## 2021-02-03 DIAGNOSIS — R5383 Other fatigue: Secondary | ICD-10-CM | POA: Diagnosis not present

## 2021-02-04 NOTE — Progress Notes (Signed)
Debbie Hancock,   Both lungs clear. No acute changes. How are you feeling today?

## 2021-02-06 ENCOUNTER — Other Ambulatory Visit: Payer: Self-pay | Admitting: Physician Assistant

## 2021-02-06 DIAGNOSIS — S0300XA Dislocation of jaw, unspecified side, initial encounter: Secondary | ICD-10-CM

## 2021-02-10 ENCOUNTER — Encounter: Payer: Self-pay | Admitting: Physician Assistant

## 2021-02-10 DIAGNOSIS — M2550 Pain in unspecified joint: Secondary | ICD-10-CM

## 2021-02-10 DIAGNOSIS — F332 Major depressive disorder, recurrent severe without psychotic features: Secondary | ICD-10-CM

## 2021-02-10 DIAGNOSIS — F3181 Bipolar II disorder: Secondary | ICD-10-CM

## 2021-02-10 DIAGNOSIS — F419 Anxiety disorder, unspecified: Secondary | ICD-10-CM

## 2021-02-10 DIAGNOSIS — G894 Chronic pain syndrome: Secondary | ICD-10-CM

## 2021-02-10 DIAGNOSIS — N3281 Overactive bladder: Secondary | ICD-10-CM

## 2021-02-10 DIAGNOSIS — M797 Fibromyalgia: Secondary | ICD-10-CM

## 2021-02-10 DIAGNOSIS — M5030 Other cervical disc degeneration, unspecified cervical region: Secondary | ICD-10-CM

## 2021-02-11 ENCOUNTER — Other Ambulatory Visit: Payer: Self-pay | Admitting: Physician Assistant

## 2021-02-11 DIAGNOSIS — M2550 Pain in unspecified joint: Secondary | ICD-10-CM

## 2021-02-11 DIAGNOSIS — M5030 Other cervical disc degeneration, unspecified cervical region: Secondary | ICD-10-CM

## 2021-02-11 DIAGNOSIS — G894 Chronic pain syndrome: Secondary | ICD-10-CM

## 2021-02-11 DIAGNOSIS — M797 Fibromyalgia: Secondary | ICD-10-CM

## 2021-02-11 DIAGNOSIS — Z0289 Encounter for other administrative examinations: Secondary | ICD-10-CM

## 2021-02-11 MED ORDER — HYDROCODONE-ACETAMINOPHEN 10-325 MG PO TABS: 1.0000 | ORAL_TABLET | Freq: Two times a day (BID) | ORAL | 0 refills | Status: DC | PRN

## 2021-02-12 MED ORDER — NORTRIPTYLINE HCL 25 MG PO CAPS: 25.0000 mg | ORAL_CAPSULE | Freq: Two times a day (BID) | ORAL | 0 refills | Status: DC

## 2021-02-12 MED ORDER — SERTRALINE HCL 100 MG PO TABS: 100.0000 mg | ORAL_TABLET | Freq: Two times a day (BID) | ORAL | 0 refills | Status: DC

## 2021-02-12 MED ORDER — QUETIAPINE FUMARATE 100 MG PO TABS: 100.0000 mg | ORAL_TABLET | Freq: Two times a day (BID) | ORAL | 0 refills | Status: DC

## 2021-02-12 MED ORDER — ARIPIPRAZOLE 10 MG PO TABS: 10.0000 mg | ORAL_TABLET | Freq: Once | ORAL | 0 refills | Status: DC

## 2021-02-19 ENCOUNTER — Telehealth: Payer: Medicare HMO | Admitting: Nurse Practitioner

## 2021-02-19 DIAGNOSIS — J069 Acute upper respiratory infection, unspecified: Secondary | ICD-10-CM

## 2021-02-19 MED ORDER — BENZONATATE 100 MG PO CAPS: 100.0000 mg | ORAL_CAPSULE | Freq: Three times a day (TID) | ORAL | 0 refills | Status: DC | PRN

## 2021-02-19 NOTE — Progress Notes (Signed)

## 2021-03-14 ENCOUNTER — Encounter: Payer: Self-pay | Admitting: Physician Assistant

## 2021-03-14 DIAGNOSIS — Z0289 Encounter for other administrative examinations: Secondary | ICD-10-CM

## 2021-03-14 DIAGNOSIS — M797 Fibromyalgia: Secondary | ICD-10-CM

## 2021-03-14 DIAGNOSIS — M5030 Other cervical disc degeneration, unspecified cervical region: Secondary | ICD-10-CM

## 2021-03-14 DIAGNOSIS — M2550 Pain in unspecified joint: Secondary | ICD-10-CM

## 2021-03-14 DIAGNOSIS — G894 Chronic pain syndrome: Secondary | ICD-10-CM

## 2021-03-17 MED ORDER — HYDROCODONE-ACETAMINOPHEN 10-325 MG PO TABS: 1.0000 | ORAL_TABLET | Freq: Two times a day (BID) | ORAL | 0 refills | Status: DC | PRN

## 2021-04-18 ENCOUNTER — Encounter: Payer: Self-pay | Admitting: Physician Assistant

## 2021-05-05 ENCOUNTER — Other Ambulatory Visit: Payer: Self-pay | Admitting: Physician Assistant

## 2021-05-05 DIAGNOSIS — E782 Mixed hyperlipidemia: Secondary | ICD-10-CM

## 2021-05-07 ENCOUNTER — Other Ambulatory Visit: Payer: Self-pay | Admitting: Physician Assistant

## 2021-05-07 DIAGNOSIS — R062 Wheezing: Secondary | ICD-10-CM

## 2021-05-09 ENCOUNTER — Other Ambulatory Visit: Payer: Self-pay | Admitting: Physician Assistant

## 2021-05-09 DIAGNOSIS — F332 Major depressive disorder, recurrent severe without psychotic features: Secondary | ICD-10-CM

## 2021-05-14 ENCOUNTER — Other Ambulatory Visit: Payer: Self-pay | Admitting: Physician Assistant

## 2021-05-20 ENCOUNTER — Ambulatory Visit (INDEPENDENT_AMBULATORY_CARE_PROVIDER_SITE_OTHER): Payer: Medicare HMO | Admitting: Physician Assistant

## 2021-05-20 ENCOUNTER — Other Ambulatory Visit: Payer: Self-pay | Admitting: Physician Assistant

## 2021-05-20 ENCOUNTER — Other Ambulatory Visit: Payer: Self-pay

## 2021-05-20 VITALS — BP 134/85 | HR 105 | Ht 63.0 in | Wt 174.0 lb

## 2021-05-20 DIAGNOSIS — Z0000 Encounter for general adult medical examination without abnormal findings: Secondary | ICD-10-CM | POA: Diagnosis not present

## 2021-05-20 DIAGNOSIS — F332 Major depressive disorder, recurrent severe without psychotic features: Secondary | ICD-10-CM | POA: Diagnosis not present

## 2021-05-20 DIAGNOSIS — R748 Abnormal levels of other serum enzymes: Secondary | ICD-10-CM | POA: Diagnosis not present

## 2021-05-20 DIAGNOSIS — M5030 Other cervical disc degeneration, unspecified cervical region: Secondary | ICD-10-CM

## 2021-05-20 DIAGNOSIS — M2550 Pain in unspecified joint: Secondary | ICD-10-CM

## 2021-05-20 DIAGNOSIS — Z131 Encounter for screening for diabetes mellitus: Secondary | ICD-10-CM

## 2021-05-20 DIAGNOSIS — M069 Rheumatoid arthritis, unspecified: Secondary | ICD-10-CM | POA: Diagnosis not present

## 2021-05-20 DIAGNOSIS — R140 Abdominal distension (gaseous): Secondary | ICD-10-CM

## 2021-05-20 DIAGNOSIS — F3181 Bipolar II disorder: Secondary | ICD-10-CM

## 2021-05-20 DIAGNOSIS — F419 Anxiety disorder, unspecified: Secondary | ICD-10-CM

## 2021-05-20 DIAGNOSIS — Z1329 Encounter for screening for other suspected endocrine disorder: Secondary | ICD-10-CM | POA: Diagnosis not present

## 2021-05-20 DIAGNOSIS — S0300XA Dislocation of jaw, unspecified side, initial encounter: Secondary | ICD-10-CM

## 2021-05-20 DIAGNOSIS — E782 Mixed hyperlipidemia: Secondary | ICD-10-CM | POA: Diagnosis not present

## 2021-05-20 DIAGNOSIS — Z6836 Body mass index (BMI) 36.0-36.9, adult: Secondary | ICD-10-CM | POA: Diagnosis not present

## 2021-05-20 DIAGNOSIS — Z0289 Encounter for other administrative examinations: Secondary | ICD-10-CM

## 2021-05-20 DIAGNOSIS — G894 Chronic pain syndrome: Secondary | ICD-10-CM | POA: Diagnosis not present

## 2021-05-20 DIAGNOSIS — M797 Fibromyalgia: Secondary | ICD-10-CM

## 2021-05-20 DIAGNOSIS — M329 Systemic lupus erythematosus, unspecified: Secondary | ICD-10-CM | POA: Diagnosis not present

## 2021-05-20 DIAGNOSIS — N3281 Overactive bladder: Secondary | ICD-10-CM

## 2021-05-20 MED ORDER — SERTRALINE HCL 100 MG PO TABS: 100.0000 mg | ORAL_TABLET | Freq: Two times a day (BID) | ORAL | 1 refills | Status: DC

## 2021-05-20 MED ORDER — HYDROCODONE-ACETAMINOPHEN 10-325 MG PO TABS: 1.0000 | ORAL_TABLET | Freq: Two times a day (BID) | ORAL | 0 refills | Status: DC | PRN

## 2021-05-20 MED ORDER — BACLOFEN 10 MG PO TABS: 10.0000 mg | ORAL_TABLET | Freq: Two times a day (BID) | ORAL | 1 refills | Status: DC

## 2021-05-20 MED ORDER — GABAPENTIN 600 MG PO TABS: 600.0000 mg | ORAL_TABLET | Freq: Three times a day (TID) | ORAL | 1 refills | Status: DC

## 2021-05-20 MED ORDER — NORTRIPTYLINE HCL 25 MG PO CAPS: 25.0000 mg | ORAL_CAPSULE | Freq: Two times a day (BID) | ORAL | 1 refills | Status: DC

## 2021-05-20 NOTE — Patient Instructions (Addendum)
Will get abdominal ultrasound for bloating/swelling.  Get labs today.   ULAGTX and Plenity
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
## 2021-05-21 ENCOUNTER — Encounter: Payer: Self-pay | Admitting: Physician Assistant

## 2021-05-21 DIAGNOSIS — B159 Hepatitis A without hepatic coma: Secondary | ICD-10-CM | POA: Insufficient documentation

## 2021-05-21 NOTE — Progress Notes (Signed)
Debbie Hancock,   CBC looks great.  Thyroid normal range.  LDL, bad cholesterol, has worsened a bit.  HDL, good cholesterol, has improved some.  TG are still elevated but improved some.   Are you taking the lipitor daily?   Your liver enzymes continue to worsen. Will get ultrasound of liver and waiting for more of blood work to result.

## 2021-05-21 NOTE — Progress Notes (Signed)
You have hepatitis A which is an infection of the liver and it normally gets better on its own after a couple of months. It is highly contagious spread through food and water or contact with someone with it. It can cause fatigue, nausea, abdominal pain, loss of appetite and low grade fever. Recheck liver enzymes in 2 months to make sure going back down. Continue to avoid alcohol and drink plenty of fluids.

## 2021-05-22 MED ORDER — ATORVASTATIN CALCIUM 40 MG PO TABS: 40.0000 mg | ORAL_TABLET | Freq: Every day | ORAL | 3 refills | Status: DC

## 2021-05-22 NOTE — Progress Notes (Signed)
Ok for reflex testing.

## 2021-05-23 LAB — DRUG MONITORING, PANEL 6 WITH CONFIRMATION, URINE
6 Acetylmorphine: NEGATIVE ng/mL (ref <10)
Alcohol Metabolites: NEGATIVE ng/mL
Amphetamines: NEGATIVE ng/mL (ref <500)
Barbiturates: NEGATIVE ng/mL (ref <300)
Benzodiazepines: NEGATIVE ng/mL (ref <100)
Cocaine Metabolite: NEGATIVE ng/mL (ref <150)
Codeine: NEGATIVE ng/mL (ref <50)
Creatinine: 41.1 mg/dL
Hydrocodone: 389 ng/mL — ABNORMAL HIGH (ref <50)
Hydromorphone: 161 ng/mL — ABNORMAL HIGH (ref <50)
Marijuana Metabolite: NEGATIVE ng/mL (ref <20)
Methadone Metabolite: NEGATIVE ng/mL (ref <100)
Morphine: NEGATIVE ng/mL (ref <50)
Norhydrocodone: 1063 ng/mL — ABNORMAL HIGH (ref <50)
Opiates: POSITIVE ng/mL — AB (ref <100)
Oxidant: NEGATIVE ug/mL
Oxycodone: NEGATIVE ng/mL (ref <100)
Phencyclidine: NEGATIVE ng/mL (ref <25)
pH: 6.8 (ref 4.5–9.0)

## 2021-05-23 LAB — DM TEMPLATE

## 2021-05-28 LAB — COMPLETE METABOLIC PANEL WITH GFR
AG Ratio: 1.4 (calc) (ref 1.0–2.5)
ALT: 142 U/L — ABNORMAL HIGH (ref 6–29)
AST: 73 U/L — ABNORMAL HIGH (ref 10–35)
Albumin: 4.3 g/dL (ref 3.6–5.1)
Alkaline phosphatase (APISO): 127 U/L (ref 37–153)
BUN: 15 mg/dL (ref 7–25)
CO2: 26 mmol/L (ref 20–32)
Calcium: 9.8 mg/dL (ref 8.6–10.4)
Chloride: 102 mmol/L (ref 98–110)
Creat: 0.77 mg/dL (ref 0.50–1.05)
GFR, Est African American: 100 mL/min/{1.73_m2} (ref >=60)
GFR, Est Non African American: 86 mL/min/{1.73_m2} (ref >=60)
Globulin: 3.1 g/dL (calc) (ref 1.9–3.7)
Glucose, Bld: 96 mg/dL (ref 65–99)
Potassium: 4.8 mmol/L (ref 3.5–5.3)
Sodium: 138 mmol/L (ref 135–146)
Total Bilirubin: 0.3 mg/dL (ref 0.2–1.2)
Total Protein: 7.4 g/dL (ref 6.1–8.1)

## 2021-05-28 LAB — CBC WITH DIFFERENTIAL/PLATELET
Absolute Monocytes: 383 cells/uL (ref 200–950)
Basophils Absolute: 62 cells/uL (ref 0–200)
Basophils Relative: 0.7 %
Eosinophils Absolute: 303 cells/uL (ref 15–500)
Eosinophils Relative: 3.4 %
HCT: 41.9 % (ref 35.0–45.0)
Hemoglobin: 13.7 g/dL (ref 11.7–15.5)
Lymphs Abs: 1825 cells/uL (ref 850–3900)
MCH: 29.7 pg (ref 27.0–33.0)
MCHC: 32.7 g/dL (ref 32.0–36.0)
MCV: 90.9 fL (ref 80.0–100.0)
MPV: 11.7 fL (ref 7.5–12.5)
Monocytes Relative: 4.3 %
Neutro Abs: 6328 cells/uL (ref 1500–7800)
Neutrophils Relative %: 71.1 %
Platelets: 272 10*3/uL (ref 140–400)
RBC: 4.61 10*6/uL (ref 3.80–5.10)
RDW: 13 % (ref 11.0–15.0)
Total Lymphocyte: 20.5 %
WBC: 8.9 10*3/uL (ref 3.8–10.8)

## 2021-05-28 LAB — HEPATITIS PANEL(REFL)
HEPATITIS C ANTIBODY REFILL$(REFL): NONREACTIVE
Hep B Core Total Ab: NONREACTIVE
Hep B S Ab: NONREACTIVE
Hepatitis A AB,Total: REACTIVE — AB
Hepatitis B Surface Ag: NONREACTIVE
SIGNAL TO CUT-OFF: 0.02 (ref <1.00)

## 2021-05-28 LAB — LIPID PANEL W/REFLEX DIRECT LDL
Cholesterol: 231 mg/dL — ABNORMAL HIGH (ref <200)
HDL: 73 mg/dL (ref >=50)
LDL Cholesterol (Calc): 122 mg/dL (calc) — ABNORMAL HIGH
Non-HDL Cholesterol (Calc): 158 mg/dL (calc) — ABNORMAL HIGH (ref <130)
Total CHOL/HDL Ratio: 3.2 (calc) (ref <5.0)
Triglycerides: 253 mg/dL — ABNORMAL HIGH (ref <150)

## 2021-05-28 LAB — CORTISOL, FREE: Cortisol Free, Ser: 0.57 ug/dL

## 2021-05-28 LAB — TSH: TSH: 0.73 mIU/L (ref 0.40–4.50)

## 2021-05-28 LAB — REFLEX TIQ

## 2021-05-30 ENCOUNTER — Encounter: Payer: Self-pay | Admitting: Physician Assistant

## 2021-05-30 DIAGNOSIS — H25813 Combined forms of age-related cataract, bilateral: Secondary | ICD-10-CM | POA: Diagnosis not present

## 2021-05-30 DIAGNOSIS — M069 Rheumatoid arthritis, unspecified: Secondary | ICD-10-CM | POA: Diagnosis not present

## 2021-05-30 DIAGNOSIS — H0100B Unspecified blepharitis left eye, upper and lower eyelids: Secondary | ICD-10-CM | POA: Diagnosis not present

## 2021-05-30 DIAGNOSIS — H527 Unspecified disorder of refraction: Secondary | ICD-10-CM | POA: Diagnosis not present

## 2021-05-30 DIAGNOSIS — H0100A Unspecified blepharitis right eye, upper and lower eyelids: Secondary | ICD-10-CM | POA: Diagnosis not present

## 2021-05-30 DIAGNOSIS — H04123 Dry eye syndrome of bilateral lacrimal glands: Secondary | ICD-10-CM | POA: Diagnosis not present

## 2021-05-30 DIAGNOSIS — M329 Systemic lupus erythematosus, unspecified: Secondary | ICD-10-CM | POA: Diagnosis not present

## 2021-06-04 ENCOUNTER — Encounter: Payer: Self-pay | Admitting: Physician Assistant

## 2021-06-04 ENCOUNTER — Other Ambulatory Visit: Payer: Self-pay | Admitting: Physician Assistant

## 2021-06-04 DIAGNOSIS — Z122 Encounter for screening for malignant neoplasm of respiratory organs: Secondary | ICD-10-CM

## 2021-06-04 DIAGNOSIS — F332 Major depressive disorder, recurrent severe without psychotic features: Secondary | ICD-10-CM

## 2021-06-04 DIAGNOSIS — Z87891 Personal history of nicotine dependence: Secondary | ICD-10-CM

## 2021-06-06 ENCOUNTER — Other Ambulatory Visit: Payer: Self-pay

## 2021-06-06 ENCOUNTER — Ambulatory Visit (INDEPENDENT_AMBULATORY_CARE_PROVIDER_SITE_OTHER): Payer: Medicare HMO

## 2021-06-06 DIAGNOSIS — R140 Abdominal distension (gaseous): Secondary | ICD-10-CM | POA: Diagnosis not present

## 2021-06-06 DIAGNOSIS — R748 Abnormal levels of other serum enzymes: Secondary | ICD-10-CM

## 2021-06-06 DIAGNOSIS — K7689 Other specified diseases of liver: Secondary | ICD-10-CM | POA: Diagnosis not present

## 2021-06-09 ENCOUNTER — Encounter: Payer: Self-pay | Admitting: Physician Assistant

## 2021-06-09 DIAGNOSIS — K760 Fatty (change of) liver, not elsewhere classified: Secondary | ICD-10-CM | POA: Insufficient documentation

## 2021-06-09 NOTE — Progress Notes (Signed)
Debbie Hancock,   Gallbladder no acute findings. You do have signs of fatty liver. Stopping alcohol and healthy diet is the best way to combat this. Likely most of your liver enzyme elevation is the hepatitis A. Hepatitis A usually clears within 8 weeks. Recheck liver enzymes then.

## 2021-06-10 ENCOUNTER — Other Ambulatory Visit: Payer: Self-pay | Admitting: *Deleted

## 2021-06-10 DIAGNOSIS — Z87891 Personal history of nicotine dependence: Secondary | ICD-10-CM

## 2021-07-21 ENCOUNTER — Encounter: Payer: Self-pay | Admitting: Acute Care

## 2021-07-21 ENCOUNTER — Other Ambulatory Visit: Payer: Self-pay

## 2021-07-21 ENCOUNTER — Ambulatory Visit (INDEPENDENT_AMBULATORY_CARE_PROVIDER_SITE_OTHER): Payer: Medicare HMO | Admitting: Acute Care

## 2021-07-21 ENCOUNTER — Ambulatory Visit (INDEPENDENT_AMBULATORY_CARE_PROVIDER_SITE_OTHER): Payer: Medicare HMO

## 2021-07-21 DIAGNOSIS — Z87891 Personal history of nicotine dependence: Secondary | ICD-10-CM | POA: Diagnosis not present

## 2021-07-21 NOTE — Patient Instructions (Signed)
Thank you for participating in the [facility]. It was our pleasure to meet you today. We will call you with the results of your scan within the next few days. Your scan will be assigned a Lung RADS category score by the physicians reading the scans.  This Lung RADS score determines follow up scanning.  See below for description of categories, and follow up screening recommendations. We will be in touch to schedule your follow up screening annually or based on recommendations of our providers. We will fax a copy of your scan results to your Primary Care Physician, or the physician who referred you to the program, to ensure they have the results. Please call the office if you have any questions or concerns regarding your scanning experience or results.  Our office number is [phone]. Please speak with Denise Phelps, RN. She is our Lung Cancer Screening RN. If she is unavailable when you call, please have the office staff send her a message. She will return your call at her earliest convenience. Remember, if your scan is normal, we will scan you annually as long as you continue to meet the criteria for the program. (Age 55-77, Current smoker or smoker who has quit within the last 15 years). If you are a smoker, remember, quitting is the single most powerful action that you can take to decrease your risk of lung cancer and other pulmonary, breathing related problems. We know quitting is hard, and we are here to help.  Please let us know if there is anything we can do to help you meet your goal of quitting. If you are a former smoker, congratulations. We are proud of you! Remain smoke free! Remember you can refer friends or family members through the number above.  We will screen them to make sure they meet criteria for the program. Thank you for helping us take better care of you by participating in Lung Screening.  Lung RADS Categories:  Lung RADS 1: no nodules  or definitely non-concerning nodules.  Recommendation is for a repeat annual scan in 12 months.  Lung RADS 2:  nodules that are non-concerning in appearance and behavior with a very low likelihood of becoming an active cancer. Recommendation is for a repeat annual scan in 12 months.  Lung RADS 3: nodules that are probably non-concerning , includes nodules with a low likelihood of becoming an active cancer.  Recommendation is for a 6-month repeat screening scan. Often noted after an upper respiratory illness. We will be in touch to make sure you have no questions, and to schedule your 6-month scan.  Lung RADS 4 A: nodules with concerning findings, recommendation is most often for a follow up scan in 3 months or additional testing based on our provider's assessment of the scan. We will be in touch to make sure you have no questions and to schedule the recommended 3 month follow up scan.  Lung RADS 4 B:  indicates findings that are concerning. We will be in touch with you to schedule additional diagnostic testing based on our provider's  assessment of the scan.   

## 2021-07-21 NOTE — Progress Notes (Signed)
Shared Decision Making Visit Lung Cancer Screening Program [phone])   Eligibility: Age 55 y.o. Pack Years Smoking History Calculation 51 pack year smoking history (# packs/per year x # years smoked) Recent History of coughing up blood  no Unexplained weight loss? no ( >Than 15 pounds within the last 6 months ) Prior History Lung / other cancer no (Diagnosis within the last 5 years already requiring surveillance chest CT Scans). Smoking Status Former Smoker Former Smokers: Years since quit: 1 year  Quit Date: 06/2020  Visit Components: Discussion included one or more decision making aids. yes Discussion included risk/benefits of screening. yes Discussion included potential follow up diagnostic testing for abnormal scans. yes Discussion included meaning and risk of over diagnosis. yes Discussion included meaning and risk of False Positives. yes Discussion included meaning of total radiation exposure. yes  Counseling Included: Importance of adherence to annual lung cancer LDCT screening. yes Impact of comorbidities on ability to participate in the program. yes Ability and willingness to under diagnostic treatment. yes  Smoking Cessation Counseling: Current Smokers:  Discussed importance of smoking cessation. yes Information about tobacco cessation classes and interventions provided to patient. yes Patient provided with "ticket" for LDCT Scan. yes Symptomatic Patient. no  Counseling Diagnosis Code: Tobacco Use Z72.0 Asymptomatic Patient yes  Counseling (Intermediate counseling: > three minutes counseling) E2683 Former Smokers:  Discussed the importance of maintaining cigarette abstinence. yes Diagnosis Code: Personal History of Nicotine Dependence. M19.622 Information about tobacco cessation classes and interventions provided to patient. Yes Patient provided with "ticket" for LDCT Scan. yes Written Order for Lung Cancer Screening with LDCT placed in Epic. Yes (CT Chest Lung  Cancer Screening Low Dose W/O CM) [id number] Z12.2-Screening of respiratory organs Z87.891-Personal history of nicotine dependence  I spent 25 minutes of face to face time with Debbie Hancock discussing the risks and benefits of lung cancer screening. We viewed a power point together that explained in detail the above noted topics. We took the time to pause the power point at intervals to allow for questions to be asked and answered to ensure understanding. We discussed that she had taken the single most powerful action possible to decrease her risk of developing lung cancer when she quit smoking. I counseled her to remain smoke free, and to contact me if she ever had the desire to smoke again so that I can provide resources and tools to help support the effort to remain smoke free. We discussed the time and location of the scan, and that either  Debbie Miyamoto RN or I will call with the results within  24-48 hours of receiving them. She has my card and contact information in the event she needs to speak with me, in addition to a copy of the power point we reviewed as a resource. She verbalized understanding of all of the above and had no further questions upon leaving the office.     I explained to the patient that there has been a high incidence of coronary artery disease noted on these exams. I explained that this is a non-gated exam therefore degree or severity cannot be determined. This patient is on statin therapy. I have asked the patient to follow-up with their PCP regarding any incidental finding of coronary artery disease and management with diet or medication as they feel is clinically indicated. The patient verbalized understanding of the above and had no further questions.  Pt. States she has been having cravings. We will send her a card with the 1-800-quit  now number for free nicotine mints or gum. I have told her not to use the patches, as she is not currently smoking, and this would be too much  nicotine     Bevelyn Ngo, NP 07/21/2021 9:24 AM

## 2021-07-25 ENCOUNTER — Encounter (INDEPENDENT_AMBULATORY_CARE_PROVIDER_SITE_OTHER): Payer: Self-pay

## 2021-07-25 ENCOUNTER — Encounter: Payer: Self-pay | Admitting: Physician Assistant

## 2021-07-25 DIAGNOSIS — M797 Fibromyalgia: Secondary | ICD-10-CM

## 2021-07-25 DIAGNOSIS — M2550 Pain in unspecified joint: Secondary | ICD-10-CM

## 2021-07-25 DIAGNOSIS — Z0289 Encounter for other administrative examinations: Secondary | ICD-10-CM

## 2021-07-25 DIAGNOSIS — M5030 Other cervical disc degeneration, unspecified cervical region: Secondary | ICD-10-CM

## 2021-07-25 DIAGNOSIS — G894 Chronic pain syndrome: Secondary | ICD-10-CM

## 2021-07-31 NOTE — Progress Notes (Signed)
Please call patient and let them  know their  low dose Ct was read as a Lung RADS 2: nodules that are benign in appearance and behavior with a very low likelihood of becoming a clinically active cancer due to size or lack of growth. Recommendation per radiology is for a repeat LDCT in 12 months. .Please let them  know we will order and schedule their  annual screening scan for 07/2022. Please let them  know there was notation of CAD on their  scan.  Please remind the patient  that this is a non-gated exam therefore degree or severity of disease  cannot be determined. Please have them  follow up with their PCP regarding potential risk factor modification, dietary therapy or pharmacologic therapy if clinically indicated. Pt.  is  currently on statin therapy. Please place order for annual  screening scan for  07/2022 and fax results to PCP. Thanks so much. 

## 2021-08-03 ENCOUNTER — Other Ambulatory Visit: Payer: Self-pay | Admitting: Physician Assistant

## 2021-08-04 ENCOUNTER — Encounter: Payer: Self-pay | Admitting: *Deleted

## 2021-08-04 DIAGNOSIS — Z87891 Personal history of nicotine dependence: Secondary | ICD-10-CM

## 2021-08-05 ENCOUNTER — Other Ambulatory Visit: Payer: Self-pay | Admitting: Physician Assistant

## 2021-08-05 DIAGNOSIS — K2960 Other gastritis without bleeding: Secondary | ICD-10-CM

## 2021-08-09 ENCOUNTER — Other Ambulatory Visit: Payer: Self-pay | Admitting: Physician Assistant

## 2021-08-18 ENCOUNTER — Other Ambulatory Visit: Payer: Self-pay | Admitting: Physician Assistant

## 2021-08-18 DIAGNOSIS — M79671 Pain in right foot: Secondary | ICD-10-CM

## 2021-08-18 DIAGNOSIS — M79672 Pain in left foot: Secondary | ICD-10-CM

## 2021-08-19 ENCOUNTER — Other Ambulatory Visit: Payer: Self-pay | Admitting: Physician Assistant

## 2021-08-19 DIAGNOSIS — Z0289 Encounter for other administrative examinations: Secondary | ICD-10-CM

## 2021-08-19 DIAGNOSIS — M5030 Other cervical disc degeneration, unspecified cervical region: Secondary | ICD-10-CM

## 2021-08-19 DIAGNOSIS — M797 Fibromyalgia: Secondary | ICD-10-CM

## 2021-08-19 DIAGNOSIS — G894 Chronic pain syndrome: Secondary | ICD-10-CM

## 2021-08-19 DIAGNOSIS — M2550 Pain in unspecified joint: Secondary | ICD-10-CM

## 2021-08-19 MED ORDER — HYDROCODONE-ACETAMINOPHEN 10-325 MG PO TABS: 1.0000 | ORAL_TABLET | Freq: Two times a day (BID) | ORAL | 0 refills | Status: DC | PRN

## 2021-08-21 ENCOUNTER — Encounter: Payer: Self-pay | Admitting: Physician Assistant

## 2021-09-02 ENCOUNTER — Encounter: Payer: Self-pay | Admitting: Nurse Practitioner

## 2021-09-02 ENCOUNTER — Telehealth: Payer: Medicare HMO | Admitting: Nurse Practitioner

## 2021-09-02 ENCOUNTER — Encounter: Payer: Self-pay | Admitting: Physician Assistant

## 2021-09-02 DIAGNOSIS — N3000 Acute cystitis without hematuria: Secondary | ICD-10-CM | POA: Diagnosis not present

## 2021-09-02 MED ORDER — SULFAMETHOXAZOLE-TRIMETHOPRIM 800-160 MG PO TABS
1.0000 | ORAL_TABLET | Freq: Two times a day (BID) | ORAL | 0 refills | Status: AC
Start: 2021-09-02 — End: 2021-09-07
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
## 2021-09-09 ENCOUNTER — Encounter: Payer: Self-pay | Admitting: Physician Assistant

## 2021-09-09 ENCOUNTER — Telehealth: Payer: Medicare HMO | Admitting: Physician Assistant

## 2021-09-09 DIAGNOSIS — Z20822 Contact with and (suspected) exposure to covid-19: Secondary | ICD-10-CM

## 2021-09-09 MED ORDER — PSEUDOEPH-BROMPHEN-DM 30-2-10 MG/5ML PO SYRP: 5.0000 mL | ORAL_SOLUTION | Freq: Four times a day (QID) | ORAL | 0 refills | Status: DC | PRN

## 2021-09-09 MED ORDER — ALBUTEROL SULFATE HFA 108 (90 BASE) MCG/ACT IN AERS: 2.0000 | INHALATION_SPRAY | Freq: Four times a day (QID) | RESPIRATORY_TRACT | 0 refills | Status: DC | PRN

## 2021-09-09 MED ORDER — FLUTICASONE PROPIONATE 50 MCG/ACT NA SUSP
2.0000 | Freq: Every day | NASAL | 0 refills | Status: DC
Start: 2021-09-09 — End: 2023-01-25

## 2021-09-09 MED ORDER — BENZONATATE 100 MG PO CAPS
100.0000 mg | ORAL_CAPSULE | Freq: Three times a day (TID) | ORAL | 0 refills | Status: DC | PRN
Start: 2021-09-09 — End: 2021-10-13
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
## 2021-09-09 NOTE — Patient Instructions (Signed)
Hello Debbie Hancock,  You are being placed in the home monitoring program for COVID-19 (commonly known as Coronavirus).  This is because you are suspected to have the virus or are known to have the virus.  If you are unsure which group you fall into call your clinic.    As part of this program, you'll answer a daily questionnaire in the MyChart mobile app. You'll receive a notification through the MyChart app when the questionnaire is available. When you log in to MyChart, you'll see the tasks in your To Do activity.       Clinicians will see any answers that are concerning and take appropriate steps.  If at any point you are having a medical emergency, call 911.  If otherwise concerned call your clinic instead of coming into the clinic or hospital.  To keep from spreading the disease you should: Stay home and limit contact with other people as much as possible.  Wash your hands frequently. Cover your coughs and sneezes with a tissue, and throw used tissues in the trash.   Clean and disinfect frequently touched surfaces and objects.    Take care of yourself by: Staying home Resting Drinking fluids Take fever-reducing medications (Tylenol/Acetaminophen and Ibuprofen)  For more information on the disease go to the Centers for Disease Control and Prevention website     COVID-19: What to Do if You Are Sick If you test positive and are an older adult or someone who is at high risk of getting very sick from COVID-19, treatment may be available. Contact a healthcare provider right away after a positive test to determine if you are eligible, even if your symptoms are mild right now. You can also visit a Test to Treat location and, if eligible, receive a prescription from a provider. Don't delay: Treatment must be started within the first few days to be effective. If you have a fever, cough, or other symptoms, you might have COVID-19. Most people have mild illness and are able to recover at home. If you  are sick: Keep track of your symptoms. If you have an emergency warning sign (including trouble breathing), call 911. Steps to help prevent the spread of COVID-19 if you are sick If you are sick with COVID-19 or think you might have COVID-19, follow the steps below to care for yourself and to help protect other people in your home and community. Stay home except to get medical care Stay home. Most people with COVID-19 have mild illness and can recover at home without medical care. Do not leave your home, except to get medical care. Do not visit public areas and do not go to places where you are unable to wear a mask. Take care of yourself. Get rest and stay hydrated. Take over-the-counter medicines, such as acetaminophen, to help you feel better. Stay in touch with your doctor. Call before you get medical care. Be sure to get care if you have trouble breathing, or have any other emergency warning signs, or if you think it is an emergency. Avoid public transportation, ride-sharing, or taxis if possible. Get tested If you have symptoms of COVID-19, get tested. While waiting for test results, stay away from others, including staying apart from those living in your household. Get tested as soon as possible after your symptoms start. Treatments may be available for people with COVID-19 who are at risk for becoming very sick. Don't delay: Treatment must be started early to be effective--some treatments must begin within 5 days  of your first symptoms. Contact your healthcare provider right away if your test result is positive to determine if you are eligible. Self-tests are one of several options for testing for the virus that causes COVID-19 and may be more convenient than laboratory-based tests and point-of-care tests. Ask your healthcare provider or your local health department if you need help interpreting your test results. You can visit your state, tribal, local, and territorial health department's  website to look for the latest local information on testing sites. Separate yourself from other people As much as possible, stay in a specific room and away from other people and pets in your home. If possible, you should use a separate bathroom. If you need to be around other people or animals in or outside of the home, wear a well-fitting mask. Tell your close contacts that they may have been exposed to COVID-19. An infected person can spread COVID-19 starting 48 hours (or 2 days) before the person has any symptoms or tests positive. By letting your close contacts know they may have been exposed to COVID-19, you are helping to protect everyone. See COVID-19 and Animals if you have questions about pets. If you are diagnosed with COVID-19, someone from the health department may call you. Answer the call to slow the spread. Monitor your symptoms Symptoms of COVID-19 include fever, cough, or other symptoms. Follow care instructions from your healthcare provider and local health department. Your local health authorities may give instructions on checking your symptoms and reporting information. When to seek emergency medical attention Look for emergency warning signs* for COVID-19. If someone is showing any of these signs, seek emergency medical care immediately: Trouble breathing Persistent pain or pressure in the chest New confusion Inability to wake or stay awake Pale, gray, or blue-colored skin, lips, or nail beds, depending on skin tone *This list is not all possible symptoms. Please call your medical provider for any other symptoms that are severe or concerning to you. Call 911 or call ahead to your local emergency facility: Notify the operator that you are seeking care for someone who has or may have COVID-19. Call ahead before visiting your doctor Call ahead. Many medical visits for routine care are being postponed or done by phone or telemedicine. If you have a medical appointment that  cannot be postponed, call your doctor's office, and tell them you have or may have COVID-19. This will help the office protect themselves and other patients. If you are sick, wear a well-fitting mask You should wear a mask if you must be around other people or animals, including pets (even at home). Wear a mask with the best fit, protection, and comfort for you. You don't need to wear the mask if you are alone. If you can't put on a mask (because of trouble breathing, for example), cover your coughs and sneezes in some other way. Try to stay at least 6 feet away from other people. This will help protect the people around you. Masks should not be placed on young children under age 8 years, anyone who has trouble breathing, or anyone who is not able to remove the mask without help. Cover your coughs and sneezes Cover your mouth and nose with a tissue when you cough or sneeze. Throw away used tissues in a lined trash can. Immediately wash your hands with soap and water for at least 20 seconds. If soap and water are not available, clean your hands with an alcohol-based hand sanitizer that contains at least  60% alcohol. Clean your hands often Wash your hands often with soap and water for at least 20 seconds. This is especially important after blowing your nose, coughing, or sneezing; going to the bathroom; and before eating or preparing food. Use hand sanitizer if soap and water are not available. Use an alcohol-based hand sanitizer with at least 60% alcohol, covering all surfaces of your hands and rubbing them together until they feel dry. Soap and water are the best option, especially if hands are visibly dirty. Avoid touching your eyes, nose, and mouth with unwashed hands. Handwashing Tips Avoid sharing personal household items Do not share dishes, drinking glasses, cups, eating utensils, towels, or bedding with other people in your home. Wash these items thoroughly after using them with soap and  water or put in the dishwasher. Clean surfaces in your home regularly Clean and disinfect high-touch surfaces (for example, doorknobs, tables, handles, light switches, and countertops) in your "sick room" and bathroom. In shared spaces, you should clean and disinfect surfaces and items after each use by the person who is ill. If you are sick and cannot clean, a caregiver or other person should only clean and disinfect the area around you (such as your bedroom and bathroom) on an as needed basis. Your caregiver/other person should wait as long as possible (at least several hours) and wear a mask before entering, cleaning, and disinfecting shared spaces that you use. Clean and disinfect areas that may have blood, stool, or body fluids on them. Use household cleaners and disinfectants. Clean visible dirty surfaces with household cleaners containing soap or detergent. Then, use a household disinfectant. Use a product from [employer] List N: Disinfectants for Coronavirus (COVID-19). Be sure to follow the instructions on the label to ensure safe and effective use of the product. Many products recommend keeping the surface wet with a disinfectant for a certain period of time (look at "contact time" on the product label). You may also need to wear personal protective equipment, such as gloves, depending on the directions on the product label. Immediately after disinfecting, wash your hands with soap and water for 20 seconds. For completed guidance on cleaning and disinfecting your home, visit Complete Disinfection Guidance. Take steps to improve ventilation at home Improve ventilation (air flow) at home to help prevent from spreading COVID-19 to other people in your household. Clear out COVID-19 virus particles in the air by opening windows, using air filters, and turning on fans in your home. Use this interactive tool to learn how to improve air flow in your home. When you can be around others after being sick  with COVID-19 Deciding when you can be around others is different for different situations. Find out when you can safely end home isolation. For any additional questions about your care, contact your healthcare provider or state or local health department. 03/04/2021 Content source: [facility] ([facility]), Division of Viral Diseases This information is not intended to replace advice given to you by your health care provider. Make sure you discuss any questions you have with your health care provider. Document Revised: 04/17/2021 Document Reviewed: 04/17/2021 Elsevier Patient Education  2022 Elsevier Inc.   Can take to lessen severity: Vit C 500mg  twice daily Quercertin 250-500mg  twice daily Zinc 75-100mg  daily Melatonin 3-6 mg at bedtime Vit D3 1000-2000 IU daily Aspirin 81 mg daily with food Optional: Famotidine 20mg  daily Also can add tylenol/ibuprofen as needed for fevers and body aches May add Mucinex or Mucinex  DM as needed for cough/congestion   10 Things You Can Do to Manage Your COVID-19 Symptoms at Home If you have possible or confirmed COVID-19 Stay home except to get medical care. Monitor your symptoms carefully. If your symptoms get worse, call your healthcare provider immediately. Get rest and stay hydrated. If you have a medical appointment, call the healthcare provider ahead of time and tell them that you have or may have COVID-19. For medical emergencies, call 911 and notify the dispatch personnel that you have or may have COVID-19. Cover your cough and sneezes with a tissue or use the inside of your elbow. Wash your hands often with soap and water for at least 20 seconds or clean your hands with an alcohol-based hand sanitizer that contains at least 60% alcohol. As much as possible, stay in a specific room and away from other people in your home. Also, you should use a separate bathroom, if available. If you need to be around  other people in or outside of the home, wear a mask. Avoid sharing personal items with other people in your household, like dishes, towels, and bedding. Clean all surfaces that are touched often, like counters, tabletops, and doorknobs. Use household cleaning sprays or wipes according to the label instructions. [email or web address] 06/28/2020 This information is not intended to replace advice given to you by your health care provider. Make sure you discuss any questions you have with your health care provider. Document Revised: 04/17/2021 Document Reviewed: 04/17/2021 Elsevier Patient Education  2022 [employer].

## 2021-09-16 ENCOUNTER — Telehealth: Payer: Medicare HMO | Admitting: Physician Assistant

## 2021-09-16 ENCOUNTER — Encounter: Payer: Self-pay | Admitting: Physician Assistant

## 2021-09-16 DIAGNOSIS — Z0289 Encounter for other administrative examinations: Secondary | ICD-10-CM

## 2021-09-16 DIAGNOSIS — M2550 Pain in unspecified joint: Secondary | ICD-10-CM

## 2021-09-16 DIAGNOSIS — M5030 Other cervical disc degeneration, unspecified cervical region: Secondary | ICD-10-CM

## 2021-09-16 DIAGNOSIS — M797 Fibromyalgia: Secondary | ICD-10-CM

## 2021-09-16 DIAGNOSIS — R3989 Other symptoms and signs involving the genitourinary system: Secondary | ICD-10-CM

## 2021-09-16 DIAGNOSIS — G894 Chronic pain syndrome: Secondary | ICD-10-CM

## 2021-09-16 MED ORDER — HYDROCODONE-ACETAMINOPHEN 10-325 MG PO TABS: 1.0000 | ORAL_TABLET | Freq: Two times a day (BID) | ORAL | 0 refills | Status: DC | PRN

## 2021-09-16 MED ORDER — NITROFURANTOIN MONOHYD MACRO 100 MG PO CAPS
100.0000 mg | ORAL_CAPSULE | Freq: Two times a day (BID) | ORAL | 0 refills | Status: DC
Start: 2021-09-16 — End: 2021-10-13

## 2021-09-16 NOTE — Patient Instructions (Signed)
Debbie Hancock, thank you for joining Margaretann Loveless, PA-C for today's virtual visit.  While this provider is not your primary care provider (PCP), if your PCP is located in our provider database this encounter information will be shared with them immediately following your visit.  Consent: (Patient) Debbie Hancock provided verbal consent for this virtual visit at the beginning of the encounter.  Current Medications:  Current Outpatient Medications:    nitrofurantoin, macrocrystal-monohydrate, (MACROBID) 100 MG capsule, Take 1 capsule (100 mg total) by mouth 2 (two) times daily., Disp: 10 capsule, Rfl: 0   albuterol (VENTOLIN HFA) 108 (90 Base) MCG/ACT inhaler, Inhale 2 puffs into the lungs every 6 (six) hours as needed for wheezing or shortness of breath., Disp: 8 g, Rfl: 0   ARIPiprazole (ABILIFY) 10 MG tablet, TAKE ONE TABLET BY MOUTH DAILY, Disp: 90 tablet, Rfl: 0   atorvastatin (LIPITOR) 40 MG tablet, Take 1 tablet (40 mg total) by mouth daily., Disp: 90 tablet, Rfl: 3   baclofen (LIORESAL) 10 MG tablet, Take 1 tablet (10 mg total) by mouth 2 (two) times daily., Disp: 180 tablet, Rfl: 1   benzonatate (TESSALON) 100 MG capsule, Take 1 capsule (100 mg total) by mouth 3 (three) times daily as needed., Disp: 30 capsule, Rfl: 0   brompheniramine-pseudoephedrine-DM 30-2-10 MG/5ML syrup, Take 5 mLs by mouth 4 (four) times daily as needed., Disp: 120 mL, Rfl: 0   diclofenac (VOLTAREN) 75 MG EC tablet, Take 1 tablet (75 mg total) by mouth 2 (two) times daily., Disp: 180 tablet, Rfl: 3   diclofenac Sodium (VOLTAREN) 1 % GEL, APPLY FOUR GRAMS FOUR TIMES A DAY TO AFFECTED JOINT, Disp: 100 g, Rfl: 2   fluticasone (FLONASE) 50 MCG/ACT nasal spray, Place 2 sprays into both nostrils daily., Disp: 16 g, Rfl: 0   gabapentin (NEURONTIN) 600 MG tablet, Take 1 tablet (600 mg total) by mouth 3 (three) times daily., Disp: 270 tablet, Rfl: 1   HYDROcodone-acetaminophen (NORCO) 10-325 MG tablet, Take 1 tablet  by mouth every 12 (twelve) hours as needed., Disp: 60 tablet, Rfl: 0   HYDROcodone-acetaminophen (NORCO) 10-325 MG tablet, Take 1 tablet by mouth every 12 (twelve) hours as needed., Disp: 60 tablet, Rfl: 0   HYDROcodone-acetaminophen (NORCO) 10-325 MG tablet, Take 1 tablet by mouth every 12 (twelve) hours as needed., Disp: 60 tablet, Rfl: 0   loratadine (CLARITIN) 10 MG tablet, Take 10 mg by mouth daily. Takes equate brand, Disp: , Rfl:    metroNIDAZOLE (METROGEL) 1 % gel, Apply topically daily., Disp: 60 g, Rfl: 2   montelukast (SINGULAIR) 10 MG tablet, TAKE ONE TABLET BY MOUTH AT BEDTIME, Disp: 90 tablet, Rfl: 3   nortriptyline (PAMELOR) 25 MG capsule, Take 1 capsule (25 mg total) by mouth 2 (two) times daily., Disp: 180 capsule, Rfl: 1   omeprazole (PRILOSEC) 40 MG capsule, TAKE ONE CAPSULE BY MOUTH DAILY, Disp: 90 capsule, Rfl: 3   OXYTROL FOR WOMEN 3.9 MG/24HR, APPLY 1 PATCH TOPICALLY EVERY 72 HOURS, Disp: , Rfl:    QUEtiapine (SEROQUEL) 100 MG tablet, Take 1 tablet (100 mg total) by mouth 2 (two) times daily., Disp: 180 tablet, Rfl: 1   sertraline (ZOLOFT) 100 MG tablet, Take 1 tablet (100 mg total) by mouth in the morning and at bedtime., Disp: 180 tablet, Rfl: 1   Medications ordered in this encounter:  Meds ordered this encounter  Medications   nitrofurantoin, macrocrystal-monohydrate, (MACROBID) 100 MG capsule    Sig: Take 1 capsule (100 mg total) by  mouth 2 (two) times daily.    Dispense:  10 capsule    Refill:  0    Order Specific Question:   Supervising Provider    Answer:   Hyacinth Meeker, BRIAN [[id number]]     *If you need refills on other medications prior to your next appointment, please contact your pharmacy*  Follow-Up: Call back or seek an in-person evaluation if the symptoms worsen or if the condition fails to improve as anticipated.  Other Instructions Urinary Tract Infection, Adult A urinary tract infection (UTI) is an infection of any part of the urinary tract. The urinary  tract includes: The kidneys. The ureters. The bladder. The urethra. These organs make, store, and get rid of pee (urine) in the body. What are the causes? This infection is caused by germs (bacteria) in your genital area. These germs grow and cause swelling (inflammation) of your urinary tract. What increases the risk? The following factors may make you more likely to develop this condition: Using a small, thin tube (catheter) to drain pee. Not being able to control when you pee or poop (incontinence). Being female. If you are female, these things can increase the risk: Using these methods to prevent pregnancy: A medicine that kills sperm (spermicide). A device that blocks sperm (diaphragm). Having low levels of a female hormone (estrogen). Being pregnant. You are more likely to develop this condition if: You have genes that add to your risk. You are sexually active. You take antibiotic medicines. You have trouble peeing because of: A prostate that is bigger than normal, if you are female. A blockage in the part of your body that drains pee from the bladder. A kidney stone. A nerve condition that affects your bladder. Not getting enough to drink. Not peeing often enough. You have other conditions, such as: Diabetes. A weak disease-fighting system (immune system). Sickle cell disease. Gout. Injury of the spine. What are the signs or symptoms? Symptoms of this condition include: Needing to pee right away. Peeing small amounts often. Pain or burning when peeing. Blood in the pee. Pee that smells bad or not like normal. Trouble peeing. Pee that is cloudy. Fluid coming from the vagina, if you are female. Pain in the belly or lower back. Other symptoms include: Vomiting. Not feeling hungry. Feeling mixed up (confused). This may be the first symptom in older adults. Being tired and grouchy (irritable). A fever. Watery poop (diarrhea). How is this treated? Taking  antibiotic medicine. Taking other medicines. Drinking enough water. In some cases, you may need to see a specialist. Follow these instructions at home: Medicines Take over-the-counter and prescription medicines only as told by your doctor. If you were prescribed an antibiotic medicine, take it as told by your doctor. Do not stop taking it even if you start to feel better. General instructions Make sure you: Pee until your bladder is empty. Do not hold pee for a long time. Empty your bladder after sex. Wipe from front to back after peeing or pooping if you are a female. Use each tissue one time when you wipe. Drink enough fluid to keep your pee pale yellow. Keep all follow-up visits. Contact a doctor if: You do not get better after 1-2 days. Your symptoms go away and then come back. Get help right away if: You have very bad back pain. You have very bad pain in your lower belly. You have a fever. You have chills. You feeling like you will vomit or you vomit. Summary A urinary tract  infection (UTI) is an infection of any part of the urinary tract. This condition is caused by germs in your genital area. There are many risk factors for a UTI. Treatment includes antibiotic medicines. Drink enough fluid to keep your pee pale yellow. This information is not intended to replace advice given to you by your health care provider. Make sure you discuss any questions you have with your health care provider. Document Revised: 07/12/2020 Document Reviewed: 07/12/2020 Elsevier Patient Education  2022 [employer].    If you have been instructed to have an in-person evaluation today at a local Urgent Care facility, please use the link below. It will take you to a list of all of our available [facility], including address, phone number and hours of operation. Please do not delay care.  [facility]  If you or a family member do not have a primary care provider, use the  link below to schedule a visit and establish care. When you choose a [facility] primary care physician or advanced practice provider, you gain a long-term partner in health. Find a Primary Care Provider  Learn more about [facility]'s in-office and virtual care options: [place] - Get Care Now

## 2021-09-16 NOTE — Telephone Encounter (Signed)
I called and left a message and replied to patient on mychart to let her know she needed a f/u appointment for her refill. - CF
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
## 2021-10-02 ENCOUNTER — Other Ambulatory Visit: Payer: Self-pay | Admitting: Physician Assistant

## 2021-10-02 DIAGNOSIS — M79671 Pain in right foot: Secondary | ICD-10-CM

## 2021-10-13 ENCOUNTER — Other Ambulatory Visit: Payer: Self-pay

## 2021-10-13 ENCOUNTER — Ambulatory Visit (INDEPENDENT_AMBULATORY_CARE_PROVIDER_SITE_OTHER): Payer: Medicare HMO | Admitting: Physician Assistant

## 2021-10-13 VITALS — BP 152/87 | HR 113 | Ht 63.0 in | Wt 178.0 lb

## 2021-10-13 DIAGNOSIS — M79671 Pain in right foot: Secondary | ICD-10-CM

## 2021-10-13 DIAGNOSIS — R062 Wheezing: Secondary | ICD-10-CM

## 2021-10-13 DIAGNOSIS — Z0289 Encounter for other administrative examinations: Secondary | ICD-10-CM

## 2021-10-13 DIAGNOSIS — F3181 Bipolar II disorder: Secondary | ICD-10-CM

## 2021-10-13 DIAGNOSIS — Z1231 Encounter for screening mammogram for malignant neoplasm of breast: Secondary | ICD-10-CM

## 2021-10-13 DIAGNOSIS — G894 Chronic pain syndrome: Secondary | ICD-10-CM

## 2021-10-13 DIAGNOSIS — M797 Fibromyalgia: Secondary | ICD-10-CM | POA: Diagnosis not present

## 2021-10-13 DIAGNOSIS — M5030 Other cervical disc degeneration, unspecified cervical region: Secondary | ICD-10-CM | POA: Diagnosis not present

## 2021-10-13 DIAGNOSIS — K2960 Other gastritis without bleeding: Secondary | ICD-10-CM | POA: Diagnosis not present

## 2021-10-13 DIAGNOSIS — M2550 Pain in unspecified joint: Secondary | ICD-10-CM | POA: Diagnosis not present

## 2021-10-13 DIAGNOSIS — Z23 Encounter for immunization: Secondary | ICD-10-CM

## 2021-10-13 DIAGNOSIS — F419 Anxiety disorder, unspecified: Secondary | ICD-10-CM | POA: Diagnosis not present

## 2021-10-13 DIAGNOSIS — F332 Major depressive disorder, recurrent severe without psychotic features: Secondary | ICD-10-CM | POA: Diagnosis not present

## 2021-10-13 DIAGNOSIS — N3281 Overactive bladder: Secondary | ICD-10-CM

## 2021-10-13 DIAGNOSIS — M79672 Pain in left foot: Secondary | ICD-10-CM

## 2021-10-13 MED ORDER — ARIPIPRAZOLE 10 MG PO TABS: 10.0000 mg | ORAL_TABLET | Freq: Every day | ORAL | 1 refills | Status: DC

## 2021-10-13 MED ORDER — MONTELUKAST SODIUM 10 MG PO TABS: 10.0000 mg | ORAL_TABLET | Freq: Every day | ORAL | 3 refills | Status: DC

## 2021-10-13 MED ORDER — HYDROCODONE-ACETAMINOPHEN 10-325 MG PO TABS: 1.0000 | ORAL_TABLET | Freq: Two times a day (BID) | ORAL | 0 refills | Status: DC | PRN

## 2021-10-13 MED ORDER — OMEPRAZOLE 40 MG PO CPDR: 40.0000 mg | DELAYED_RELEASE_CAPSULE | Freq: Every day | ORAL | 3 refills | Status: DC

## 2021-10-13 MED ORDER — DICLOFENAC SODIUM 75 MG PO TBEC: 75.0000 mg | DELAYED_RELEASE_TABLET | Freq: Two times a day (BID) | ORAL | 3 refills | Status: DC

## 2021-10-13 MED ORDER — GABAPENTIN 600 MG PO TABS: 600.0000 mg | ORAL_TABLET | Freq: Three times a day (TID) | ORAL | 1 refills | Status: DC

## 2021-10-13 MED ORDER — SERTRALINE HCL 100 MG PO TABS: 100.0000 mg | ORAL_TABLET | Freq: Two times a day (BID) | ORAL | 1 refills | Status: DC

## 2021-10-13 MED ORDER — NORTRIPTYLINE HCL 50 MG PO CAPS: 50.0000 mg | ORAL_CAPSULE | Freq: Two times a day (BID) | ORAL | 1 refills | Status: DC

## 2021-10-13 MED ORDER — QUETIAPINE FUMARATE 100 MG PO TABS: 100.0000 mg | ORAL_TABLET | Freq: Two times a day (BID) | ORAL | 1 refills | Status: DC
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
## 2021-10-16 ENCOUNTER — Other Ambulatory Visit: Payer: Self-pay

## 2021-10-16 ENCOUNTER — Ambulatory Visit (INDEPENDENT_AMBULATORY_CARE_PROVIDER_SITE_OTHER): Payer: Medicare HMO

## 2021-10-16 DIAGNOSIS — Z1231 Encounter for screening mammogram for malignant neoplasm of breast: Secondary | ICD-10-CM | POA: Diagnosis not present

## 2021-10-17 ENCOUNTER — Ambulatory Visit (INDEPENDENT_AMBULATORY_CARE_PROVIDER_SITE_OTHER): Payer: Medicare HMO | Admitting: Physician Assistant

## 2021-10-17 VITALS — Temp 98.1°F | Ht 63.0 in | Wt 181.3 lb

## 2021-10-17 DIAGNOSIS — Z23 Encounter for immunization: Secondary | ICD-10-CM

## 2021-10-17 NOTE — Progress Notes (Signed)
Pt presents for 2nd shingles vaccine. Tolerated well.   Location: LD

## 2021-10-17 NOTE — Progress Notes (Signed)
Normal mammogram. Follow up in 1 year.

## 2021-10-17 NOTE — Progress Notes (Signed)
Agree with same. plan/

## 2021-10-20 ENCOUNTER — Encounter: Payer: Self-pay | Admitting: Physician Assistant

## 2021-11-10 ENCOUNTER — Other Ambulatory Visit: Payer: Self-pay | Admitting: Physician Assistant

## 2021-11-10 DIAGNOSIS — M2550 Pain in unspecified joint: Secondary | ICD-10-CM

## 2021-11-10 DIAGNOSIS — G894 Chronic pain syndrome: Secondary | ICD-10-CM

## 2021-11-10 DIAGNOSIS — Z0289 Encounter for other administrative examinations: Secondary | ICD-10-CM

## 2021-11-10 DIAGNOSIS — M797 Fibromyalgia: Secondary | ICD-10-CM

## 2021-11-10 DIAGNOSIS — M5030 Other cervical disc degeneration, unspecified cervical region: Secondary | ICD-10-CM

## 2021-11-10 DIAGNOSIS — R252 Cramp and spasm: Secondary | ICD-10-CM

## 2021-11-12 ENCOUNTER — Other Ambulatory Visit: Payer: Self-pay | Admitting: Physician Assistant

## 2021-11-12 DIAGNOSIS — F3181 Bipolar II disorder: Secondary | ICD-10-CM

## 2021-11-12 DIAGNOSIS — F332 Major depressive disorder, recurrent severe without psychotic features: Secondary | ICD-10-CM

## 2021-11-12 DIAGNOSIS — F419 Anxiety disorder, unspecified: Secondary | ICD-10-CM

## 2021-11-12 MED ORDER — HYDROCODONE-ACETAMINOPHEN 10-325 MG PO TABS: 1.0000 | ORAL_TABLET | Freq: Two times a day (BID) | ORAL | 0 refills | Status: DC | PRN

## 2021-11-13 ENCOUNTER — Encounter: Payer: Self-pay | Admitting: Physician Assistant

## 2022-01-13 ENCOUNTER — Ambulatory Visit: Payer: Medicare HMO | Admitting: Physician Assistant

## 2022-01-28 ENCOUNTER — Encounter: Payer: Self-pay | Admitting: Physician Assistant

## 2022-01-28 ENCOUNTER — Telehealth (INDEPENDENT_AMBULATORY_CARE_PROVIDER_SITE_OTHER): Payer: Medicare HMO | Admitting: Physician Assistant

## 2022-01-28 DIAGNOSIS — M5030 Other cervical disc degeneration, unspecified cervical region: Secondary | ICD-10-CM | POA: Diagnosis not present

## 2022-01-28 DIAGNOSIS — M2550 Pain in unspecified joint: Secondary | ICD-10-CM

## 2022-01-28 DIAGNOSIS — Z0289 Encounter for other administrative examinations: Secondary | ICD-10-CM

## 2022-01-28 DIAGNOSIS — N3281 Overactive bladder: Secondary | ICD-10-CM | POA: Diagnosis not present

## 2022-01-28 DIAGNOSIS — M797 Fibromyalgia: Secondary | ICD-10-CM

## 2022-01-28 DIAGNOSIS — M069 Rheumatoid arthritis, unspecified: Secondary | ICD-10-CM | POA: Diagnosis not present

## 2022-01-28 DIAGNOSIS — R062 Wheezing: Secondary | ICD-10-CM

## 2022-01-28 DIAGNOSIS — F3181 Bipolar II disorder: Secondary | ICD-10-CM

## 2022-01-28 DIAGNOSIS — G894 Chronic pain syndrome: Secondary | ICD-10-CM | POA: Diagnosis not present

## 2022-01-28 DIAGNOSIS — F332 Major depressive disorder, recurrent severe without psychotic features: Secondary | ICD-10-CM

## 2022-01-28 DIAGNOSIS — F419 Anxiety disorder, unspecified: Secondary | ICD-10-CM

## 2022-01-28 DIAGNOSIS — M130 Polyarthritis, unspecified: Secondary | ICD-10-CM

## 2022-01-28 DIAGNOSIS — Z79891 Long term (current) use of opiate analgesic: Secondary | ICD-10-CM

## 2022-01-28 DIAGNOSIS — M329 Systemic lupus erythematosus, unspecified: Secondary | ICD-10-CM | POA: Diagnosis not present

## 2022-01-28 MED ORDER — NORTRIPTYLINE HCL 50 MG PO CAPS: 50.0000 mg | ORAL_CAPSULE | Freq: Two times a day (BID) | ORAL | 1 refills | Status: DC

## 2022-01-28 MED ORDER — MONTELUKAST SODIUM 10 MG PO TABS: 10.0000 mg | ORAL_TABLET | Freq: Every day | ORAL | 3 refills | Status: DC

## 2022-01-28 MED ORDER — HYDROCODONE-ACETAMINOPHEN 10-325 MG PO TABS: 1.0000 | ORAL_TABLET | Freq: Two times a day (BID) | ORAL | 0 refills | Status: DC | PRN

## 2022-01-28 MED ORDER — ATORVASTATIN CALCIUM 40 MG PO TABS: 40.0000 mg | ORAL_TABLET | Freq: Every day | ORAL | 3 refills | Status: DC

## 2022-01-28 MED ORDER — SERTRALINE HCL 100 MG PO TABS: 100.0000 mg | ORAL_TABLET | Freq: Two times a day (BID) | ORAL | 1 refills | Status: DC

## 2022-01-28 MED ORDER — QUETIAPINE FUMARATE 100 MG PO TABS: 100.0000 mg | ORAL_TABLET | Freq: Two times a day (BID) | ORAL | 1 refills | Status: DC

## 2022-01-28 MED ORDER — ARIPIPRAZOLE 10 MG PO TABS: 10.0000 mg | ORAL_TABLET | Freq: Every day | ORAL | 1 refills | Status: DC
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
## 2022-01-28 NOTE — Progress Notes (Signed)
Pt would like a refill on the following medications: sertraline,quetipine,montelukast,norco,atorvastatin,nortiptyline 50 mg,aripiprazole.

## 2022-02-06 ENCOUNTER — Other Ambulatory Visit: Payer: Self-pay | Admitting: Physician Assistant

## 2022-02-06 DIAGNOSIS — S0300XA Dislocation of jaw, unspecified side, initial encounter: Secondary | ICD-10-CM

## 2022-03-25 ENCOUNTER — Other Ambulatory Visit: Payer: Self-pay | Admitting: Physician Assistant

## 2022-03-25 DIAGNOSIS — M797 Fibromyalgia: Secondary | ICD-10-CM

## 2022-03-25 DIAGNOSIS — G894 Chronic pain syndrome: Secondary | ICD-10-CM

## 2022-03-31 ENCOUNTER — Ambulatory Visit: Payer: Medicare HMO | Admitting: Physician Assistant

## 2022-03-31 DIAGNOSIS — Z124 Encounter for screening for malignant neoplasm of cervix: Secondary | ICD-10-CM

## 2022-04-08 ENCOUNTER — Telehealth: Payer: Self-pay

## 2022-04-08 NOTE — Telephone Encounter (Signed)
Initiated Prior authorization TKW:[id number] Fumarate 100MG  tablets ?Via: Covermymeds ?Case/Key:[id number] ?Status: approved as of 04/08/22 ?Reason: ?Notified Pt via: Mychart ? ?

## 2022-04-27 ENCOUNTER — Other Ambulatory Visit (HOSPITAL_COMMUNITY)
Admission: RE | Admit: 2022-04-27 | Discharge: 2022-04-27 | Disposition: A | Discharge status: Home / Self Care | Payer: Medicare HMO | Source: Ambulatory Visit | Attending: Physician Assistant | Admitting: Physician Assistant

## 2022-04-27 ENCOUNTER — Ambulatory Visit (INDEPENDENT_AMBULATORY_CARE_PROVIDER_SITE_OTHER): Payer: Medicare HMO | Admitting: Physician Assistant

## 2022-04-27 ENCOUNTER — Encounter: Payer: Self-pay | Admitting: Physician Assistant

## 2022-04-27 VITALS — BP 155/94 | HR 115 | Ht 63.0 in | Wt 184.0 lb

## 2022-04-27 DIAGNOSIS — Z124 Encounter for screening for malignant neoplasm of cervix: Secondary | ICD-10-CM

## 2022-04-27 DIAGNOSIS — M797 Fibromyalgia: Secondary | ICD-10-CM

## 2022-04-27 DIAGNOSIS — Z79899 Other long term (current) drug therapy: Secondary | ICD-10-CM | POA: Diagnosis not present

## 2022-04-27 DIAGNOSIS — M5030 Other cervical disc degeneration, unspecified cervical region: Secondary | ICD-10-CM

## 2022-04-27 DIAGNOSIS — F332 Major depressive disorder, recurrent severe without psychotic features: Secondary | ICD-10-CM | POA: Diagnosis not present

## 2022-04-27 DIAGNOSIS — G894 Chronic pain syndrome: Secondary | ICD-10-CM

## 2022-04-27 DIAGNOSIS — Z114 Encounter for screening for human immunodeficiency virus [HIV]: Secondary | ICD-10-CM | POA: Diagnosis not present

## 2022-04-27 DIAGNOSIS — F419 Anxiety disorder, unspecified: Secondary | ICD-10-CM | POA: Diagnosis not present

## 2022-04-27 DIAGNOSIS — R300 Dysuria: Secondary | ICD-10-CM

## 2022-04-27 DIAGNOSIS — Z01411 Encounter for gynecological examination (general) (routine) with abnormal findings: Secondary | ICD-10-CM | POA: Insufficient documentation

## 2022-04-27 DIAGNOSIS — Z1151 Encounter for screening for human papillomavirus (HPV): Secondary | ICD-10-CM | POA: Diagnosis not present

## 2022-04-27 DIAGNOSIS — M2550 Pain in unspecified joint: Secondary | ICD-10-CM

## 2022-04-27 DIAGNOSIS — Z0289 Encounter for other administrative examinations: Secondary | ICD-10-CM

## 2022-04-27 DIAGNOSIS — F3181 Bipolar II disorder: Secondary | ICD-10-CM | POA: Diagnosis not present

## 2022-04-27 LAB — POCT URINALYSIS DIP (CLINITEK)
Bilirubin, UA: NEGATIVE
Blood, UA: NEGATIVE
Glucose, UA: NEGATIVE mg/dL
Ketones, POC UA: NEGATIVE mg/dL
Leukocytes, UA: NEGATIVE
Nitrite, UA: NEGATIVE
POC PROTEIN,UA: NEGATIVE
Spec Grav, UA: <=1.005 — AB (ref 1.010–1.025)
Urobilinogen, UA: 0.2 E.U./dL
pH, UA: 6 (ref 5.0–8.0)

## 2022-04-27 MED ORDER — GABAPENTIN 600 MG PO TABS: 600.0000 mg | ORAL_TABLET | Freq: Three times a day (TID) | ORAL | 3 refills | Status: DC

## 2022-04-27 MED ORDER — QUETIAPINE FUMARATE 100 MG PO TABS: 100.0000 mg | ORAL_TABLET | Freq: Two times a day (BID) | ORAL | 1 refills | Status: DC

## 2022-04-27 MED ORDER — SERTRALINE HCL 100 MG PO TABS: 100.0000 mg | ORAL_TABLET | Freq: Two times a day (BID) | ORAL | 1 refills | Status: DC

## 2022-04-27 MED ORDER — ARIPIPRAZOLE 10 MG PO TABS: 10.0000 mg | ORAL_TABLET | Freq: Every day | ORAL | 1 refills | Status: DC

## 2022-04-27 NOTE — Telephone Encounter (Signed)
Debbie Hancock addressed during patient's visit today. ?

## 2022-04-27 NOTE — Progress Notes (Signed)
Subjective:  ?  ? Debbie Hancock is a 56 y.o. woman who comes in today for a  pap smear only. Her most recent annual exam was on 05/2021. Her most recent Pap smear was on 12/2018 and showed no abnormalities. Previous abnormal Pap smears: no. Contraception: none not sexually active.  ? ?The following portions of the patient's history were reviewed and updated as appropriate: allergies, current medications, past family history, past medical history, past social history, and problem list. ? ?Review of Systems ?A comprehensive review of systems was negative.  ? ?Her mood has been more depressed and she has been more anxious over the last month.  ? ?Objective:  ? ? There were no vitals taken for this visit. ?Pelvic Exam: cervix normal in appearance, external genitalia normal, and vagina normal without discharge. Some discomfort with speculum exam and bimanuel exam. Pap smear obtained.  ? ?Assessment:  ? ? Screening pap smear.  ?.. ?Results for orders placed or performed in visit on 04/27/22  ?POCT URINALYSIS DIP (CLINITEK)  ?Result Value Ref Range  ? Color, UA yellow yellow  ? Clarity, UA clear clear  ? Glucose, UA negative negative mg/dL  ? Bilirubin, UA negative negative  ? Ketones, POC UA negative negative mg/dL  ? Spec Grav, UA <=1.005 (A) 1.010 - 1.025  ? Blood, UA negative negative  ? pH, UA 6.0 5.0 - 8.0  ? POC PROTEIN,UA negative negative, trace  ? Urobilinogen, UA 0.2 0.2 or 1.0 E.U./dL  ? Nitrite, UA Negative Negative  ? Leukocytes, UA Negative Negative  ? ?.. ? ?  01/28/2022  ?  9:18 AM 10/13/2021  ?  1:20 PM 05/20/2021  ?  9:20 AM 02/12/2020  ? 10:43 AM 11/13/2019  ? 10:52 AM  ?Depression screen PHQ 2/9  ?Decreased Interest 0 3 2 3 2   ?Down, Depressed, Hopeless 1 2 1 3 1   ?PHQ - 2 Score 1 5 3 6 3   ?Altered sleeping 3 3 2 2 2   ?Tired, decreased energy 1 1 2 2 2   ?Change in appetite 3 3 2 3 2   ?Feeling bad or failure about yourself  0 2 1 2 1   ?Trouble concentrating 0 2 1 2 1   ?Moving slowly or fidgety/restless 1 3 2  2 2   ?Suicidal thoughts 0 0 0 0 1  ?PHQ-9 Score 9 19 13 19 14   ?Difficult doing work/chores  Very difficult Somewhat difficult Extremely dIfficult Somewhat difficult  ? ?.. ? ?  01/28/2022  ?  9:20 AM 10/13/2021  ?  1:20 PM 05/20/2021  ?  9:20 AM 02/12/2020  ? 10:43 AM  ?GAD 7 : Generalized Anxiety Score  ?Nervous, Anxious, on Edge 2 3 1 3   ?Control/stop worrying 1 2 2 3   ?Worry too much - different things 1 2 2 3   ?Trouble relaxing 1 3 1 3   ?Restless 1 3 2 3   ?Easily annoyed or irritable 0 0 2 3  ?Afraid - awful might happen 1 0 1 3  ?Total GAD 7 Score 7 13 11 21   ?Anxiety Difficulty  Very difficult Somewhat difficult Extremely difficult  ? ? ? ? ?Plan:  ? ? Follow up in 5 years, or as indicated by Pap results ? ?Pt has been on this combination since [facility] discharged her on these medications. I have concern with seroquel and abilify combination. Only 1 month of depressed mood. Follow up if worsening but will see what pharmacist thinks when she gets [facility]  really did not like seeing [facility] for medications .  ?

## 2022-04-28 ENCOUNTER — Other Ambulatory Visit: Payer: Self-pay | Admitting: Physician Assistant

## 2022-04-28 ENCOUNTER — Other Ambulatory Visit: Payer: Self-pay | Admitting: Neurology

## 2022-04-28 DIAGNOSIS — E871 Hypo-osmolality and hyponatremia: Secondary | ICD-10-CM

## 2022-04-28 LAB — CYTOLOGY - PAP
Comment: NEGATIVE
Comment: NEGATIVE
Comment: NEGATIVE
Diagnosis: NEGATIVE
HPV 16: NEGATIVE
HPV 18 / 45: NEGATIVE
High risk HPV: POSITIVE — AB

## 2022-04-28 MED ORDER — HYDROCODONE-ACETAMINOPHEN 10-325 MG PO TABS
1.0000 | ORAL_TABLET | Freq: Two times a day (BID) | ORAL | 0 refills | Status: DC | PRN
Start: 2022-06-26 — End: 2023-01-25

## 2022-04-28 MED ORDER — HYDROCODONE-ACETAMINOPHEN 10-325 MG PO TABS: 1.0000 | ORAL_TABLET | Freq: Two times a day (BID) | ORAL | 0 refills | Status: DC | PRN

## 2022-04-28 MED ORDER — HYDROCHLOROTHIAZIDE 12.5 MG PO TABS
12.5000 mg | ORAL_TABLET | Freq: Every day | ORAL | 0 refills | Status: DC
Start: 2022-04-28 — End: 2022-05-27

## 2022-04-28 NOTE — Telephone Encounter (Signed)
Patient sent another message through scheduling asking for Hydrocodone refill. This was not sent yesterday. Pended for next 3 months since seen yesterday. Sign if appropriate.  ?

## 2022-04-28 NOTE — Progress Notes (Signed)
Liver enzymes are improving.  ?Glucose looks good.  ?Kidney function looks great.  ?Sodium is too low. Ok to increase salt in diet. Start HcTZ a diuretic daily to urinate a little more. Repeat sodium level on Thursday morning. Do not over hydrate.

## 2022-04-29 ENCOUNTER — Encounter: Payer: Self-pay | Admitting: Physician Assistant

## 2022-04-29 DIAGNOSIS — R87618 Other abnormal cytological findings on specimens from cervix uteri: Secondary | ICD-10-CM | POA: Insufficient documentation

## 2022-04-29 NOTE — Progress Notes (Signed)
No abnormal cells. Your are positive for HPV which does put you at higher risk for developing abnormal cells. Lets recheck pap in 1 year.

## 2022-04-30 LAB — COMPLETE METABOLIC PANEL WITH GFR
AG Ratio: 1.3 (calc) (ref 1.0–2.5)
ALT: 51 U/L — ABNORMAL HIGH (ref 6–29)
AST: 26 U/L (ref 10–35)
Albumin: 4 g/dL (ref 3.6–5.1)
Alkaline phosphatase (APISO): 102 U/L (ref 37–153)
BUN: 10 mg/dL (ref 7–25)
CO2: 27 mmol/L (ref 20–32)
Calcium: 8.9 mg/dL (ref 8.6–10.4)
Chloride: 96 mmol/L — ABNORMAL LOW (ref 98–110)
Creat: 0.68 mg/dL (ref 0.50–1.03)
Globulin: 3 g/dL (calc) (ref 1.9–3.7)
Glucose, Bld: 89 mg/dL (ref 65–99)
Potassium: 3.8 mmol/L (ref 3.5–5.3)
Sodium: 129 mmol/L — ABNORMAL LOW (ref 135–146)
Total Bilirubin: 0.4 mg/dL (ref 0.2–1.2)
Total Protein: 7 g/dL (ref 6.1–8.1)
eGFR: 102 mL/min/{1.73_m2} (ref >=60)

## 2022-04-30 LAB — HIV ANTIBODY (ROUTINE TESTING W REFLEX): HIV 1&2 Ab, 4th Generation: NONREACTIVE

## 2022-04-30 LAB — EXTRA LAV TOP TUBE

## 2022-05-27 ENCOUNTER — Other Ambulatory Visit: Payer: Self-pay | Admitting: Physician Assistant

## 2022-06-01 ENCOUNTER — Telehealth: Payer: Medicare HMO | Admitting: Physician Assistant

## 2022-06-01 DIAGNOSIS — R3989 Other symptoms and signs involving the genitourinary system: Secondary | ICD-10-CM

## 2022-06-01 MED ORDER — SULFAMETHOXAZOLE-TRIMETHOPRIM 800-160 MG PO TABS: 1.0000 | ORAL_TABLET | Freq: Two times a day (BID) | ORAL | 0 refills | Status: DC

## 2022-06-01 NOTE — Patient Instructions (Signed)
Debbie Hancock, thank you for joining Margaretann Loveless, PA-C for today's virtual visit.  While this provider is not your primary care provider (PCP), if your PCP is located in our provider database this encounter information will be shared with them immediately following your visit.  Consent: (Patient) Debbie Hancock provided verbal consent for this virtual visit at the beginning of the encounter.  Current Medications:  Current Outpatient Medications:    sulfamethoxazole-trimethoprim (BACTRIM DS) 800-160 MG tablet, Take 1 tablet by mouth 2 (two) times daily., Disp: 10 tablet, Rfl: 0   albuterol (VENTOLIN HFA) 108 (90 Base) MCG/ACT inhaler, Inhale 2 puffs into the lungs every 6 (six) hours as needed for wheezing or shortness of breath., Disp: 8 g, Rfl: 0   ARIPiprazole (ABILIFY) 10 MG tablet, Take 1 tablet (10 mg total) by mouth daily., Disp: 90 tablet, Rfl: 1   atorvastatin (LIPITOR) 40 MG tablet, Take 1 tablet (40 mg total) by mouth daily., Disp: 90 tablet, Rfl: 3   baclofen (LIORESAL) 10 MG tablet, TAKE ONE TABLET BY MOUTH TWICE A DAY, Disp: 180 tablet, Rfl: 1   diclofenac (VOLTAREN) 75 MG EC tablet, Take 1 tablet (75 mg total) by mouth 2 (two) times daily., Disp: 180 tablet, Rfl: 3   diclofenac Sodium (VOLTAREN) 1 % GEL, APPLY FOUR GRAMS FOUR TIMES A DAY TO AFFECTED JOINT, Disp: 100 g, Rfl: 2   fluticasone (FLONASE) 50 MCG/ACT nasal spray, Place 2 sprays into both nostrils daily., Disp: 16 g, Rfl: 0   gabapentin (NEURONTIN) 600 MG tablet, Take 1 tablet (600 mg total) by mouth 3 (three) times daily., Disp: 270 tablet, Rfl: 3   hydrochlorothiazide (HYDRODIURIL) 12.5 MG tablet, TAKE 1 TABLET BY MOUTH DAILY, Disp: 90 tablet, Rfl: 0   HYDROcodone-acetaminophen (NORCO) 10-325 MG tablet, Take 1 tablet by mouth every 12 (twelve) hours as needed., Disp: 60 tablet, Rfl: 0   HYDROcodone-acetaminophen (NORCO) 10-325 MG tablet, Take 1 tablet by mouth every 12 (twelve) hours as needed., Disp: 60 tablet,  Rfl: 0   [START ON 06/26/2022] HYDROcodone-acetaminophen (NORCO) 10-325 MG tablet, Take 1 tablet by mouth every 12 (twelve) hours as needed., Disp: 60 tablet, Rfl: 0   loratadine (CLARITIN) 10 MG tablet, Take 10 mg by mouth daily. Takes equate brand, Disp: , Rfl:    metroNIDAZOLE (METROGEL) 1 % gel, Apply topically daily., Disp: 60 g, Rfl: 2   montelukast (SINGULAIR) 10 MG tablet, Take 1 tablet (10 mg total) by mouth at bedtime., Disp: 90 tablet, Rfl: 3   nortriptyline (PAMELOR) 50 MG capsule, Take 1 capsule (50 mg total) by mouth 2 (two) times daily., Disp: 180 capsule, Rfl: 1   omeprazole (PRILOSEC) 40 MG capsule, Take 1 capsule (40 mg total) by mouth daily., Disp: 90 capsule, Rfl: 3   OXYTROL FOR WOMEN 3.9 MG/24HR, APPLY 1 PATCH TOPICALLY EVERY 72 HOURS, Disp: , Rfl:    QUEtiapine (SEROQUEL) 100 MG tablet, Take 1 tablet (100 mg total) by mouth 2 (two) times daily., Disp: 180 tablet, Rfl: 1   sertraline (ZOLOFT) 100 MG tablet, Take 1 tablet (100 mg total) by mouth in the morning and at bedtime., Disp: 180 tablet, Rfl: 1   Medications ordered in this encounter:  Meds ordered this encounter  Medications   sulfamethoxazole-trimethoprim (BACTRIM DS) 800-160 MG tablet    Sig: Take 1 tablet by mouth 2 (two) times daily.    Dispense:  10 tablet    Refill:  0    Order Specific Question:   Supervising Provider  Answer:   MILLER, BRIAN [[id number]]     *If you need refills on other medications prior to your next appointment, please contact your pharmacy*  Follow-Up: Call back or seek an in-person evaluation if the symptoms worsen or if the condition fails to improve as anticipated.  Other Instructions Urinary Tract Infection, Adult  A urinary tract infection (UTI) is an infection of any part of the urinary tract. The urinary tract includes the kidneys, ureters, bladder, and urethra. These organs make, store, and get rid of urine in the body. An upper UTI affects the ureters and kidneys. A lower UTI  affects the bladder and urethra. What are the causes? Most urinary tract infections are caused by bacteria in your genital area around your urethra, where urine leaves your body. These bacteria grow and cause inflammation of your urinary tract. What increases the risk? You are more likely to develop this condition if: You have a urinary catheter that stays in place. You are not able to control when you urinate or have a bowel movement (incontinence). You are female and you: Use a spermicide or diaphragm for birth control. Have low estrogen levels. Are pregnant. You have certain genes that increase your risk. You are sexually active. You take antibiotic medicines. You have a condition that causes your flow of urine to slow down, such as: An enlarged prostate, if you are female. Blockage in your urethra. A kidney stone. A nerve condition that affects your bladder control (neurogenic bladder). Not getting enough to drink, or not urinating often. You have certain medical conditions, such as: Diabetes. A weak disease-fighting system (immunesystem). Sickle cell disease. Gout. Spinal cord injury. What are the signs or symptoms? Symptoms of this condition include: Needing to urinate right away (urgency). Frequent urination. This may include small amounts of urine each time you urinate. Pain or burning with urination. Blood in the urine. Urine that smells bad or unusual. Trouble urinating. Cloudy urine. Vaginal discharge, if you are female. Pain in the abdomen or the lower back. You may also have: Vomiting or a decreased appetite. Confusion. Irritability or tiredness. A fever or chills. Diarrhea. The first symptom in older adults may be confusion. In some cases, they may not have any symptoms until the infection has worsened. How is this diagnosed? This condition is diagnosed based on your medical history and a physical exam. You may also have other tests, including: Urine  tests. Blood tests. Tests for STIs (sexually transmitted infections). If you have had more than one UTI, a cystoscopy or imaging studies may be done to determine the cause of the infections. How is this treated? Treatment for this condition includes: Antibiotic medicine. Over-the-counter medicines to treat discomfort. Drinking enough water to stay hydrated. If you have frequent infections or have other conditions such as a kidney stone, you may need to see a health care provider who specializes in the urinary tract (urologist). In rare cases, urinary tract infections can cause sepsis. Sepsis is a life-threatening condition that occurs when the body responds to an infection. Sepsis is treated in the hospital with IV antibiotics, fluids, and other medicines. Follow these instructions at home:  Medicines Take over-the-counter and prescription medicines only as told by your health care provider. If you were prescribed an antibiotic medicine, take it as told by your health care provider. Do not stop using the antibiotic even if you start to feel better. General instructions Make sure you: Empty your bladder often and completely. Do not hold urine for long  periods of time. Empty your bladder after sex. Wipe from front to back after urinating or having a bowel movement if you are female. Use each tissue only one time when you wipe. Drink enough fluid to keep your urine pale yellow. Keep all follow-up visits. This is important. Contact a health care provider if: Your symptoms do not get better after 1-2 days. Your symptoms go away and then return. Get help right away if: You have severe pain in your back or your lower abdomen. You have a fever or chills. You have nausea or vomiting. Summary A urinary tract infection (UTI) is an infection of any part of the urinary tract, which includes the kidneys, ureters, bladder, and urethra. Most urinary tract infections are caused by bacteria in your  genital area. Treatment for this condition often includes antibiotic medicines. If you were prescribed an antibiotic medicine, take it as told by your health care provider. Do not stop using the antibiotic even if you start to feel better. Keep all follow-up visits. This is important. This information is not intended to replace advice given to you by your health care provider. Make sure you discuss any questions you have with your health care provider. Document Revised: 07/12/2020 Document Reviewed: 07/12/2020 Elsevier Patient Education  2023 [employer]vier Inc.    If you have been instructed to have an in-person evaluation today at a local Urgent Care facility, please use the link below. It will take you to a list of all of our available [facility], including address, phone number and hours of operation. Please do not delay care.  [facility]  If you or a family member do not have a primary care provider, use the link below to schedule a visit and establish care. When you choose a [facility] primary care physician or advanced practice provider, you gain a long-term partner in health. Find a Primary Care Provider  Learn more about [facility]'s in-office and virtual care options: [facility] - Get Care Now
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
## 2022-06-03 ENCOUNTER — Telehealth: Payer: Medicare HMO | Admitting: Physician Assistant

## 2022-06-03 ENCOUNTER — Telehealth: Payer: Medicare HMO | Admitting: Nurse Practitioner

## 2022-06-03 ENCOUNTER — Telehealth: Payer: Medicare HMO

## 2022-06-03 DIAGNOSIS — N3001 Acute cystitis with hematuria: Secondary | ICD-10-CM

## 2022-06-03 DIAGNOSIS — Z91199 Patient's noncompliance with other medical treatment and regimen due to unspecified reason: Secondary | ICD-10-CM

## 2022-06-03 DIAGNOSIS — R300 Dysuria: Secondary | ICD-10-CM

## 2022-06-03 MED ORDER — CEPHALEXIN 500 MG PO CAPS: 500.0000 mg | ORAL_CAPSULE | Freq: Two times a day (BID) | ORAL | 0 refills | Status: AC

## 2022-06-03 NOTE — Progress Notes (Signed)
Attempted to connect with patient via video multiple times but seems to be issue with her microphone and camera connecting. Sent messages through [facility] without response. Called x 2 and left VM with no response from patient. Will close out visit and see if she reschedules.

## 2022-06-03 NOTE — Patient Instructions (Signed)
Dajai Noorani, thank you for joining [name], PA-C for today's virtual visit.  While this provider is not your primary care provider (PCP), if your PCP is located in our provider database this encounter information will be shared with them immediately following your visit.  Consent: (Patient) Debbie Hancock provided verbal consent for this virtual visit at the beginning of the encounter.  Current Medications:  Current Outpatient Medications:    albuterol (VENTOLIN HFA) 108 (90 Base) MCG/ACT inhaler, Inhale 2 puffs into the lungs every 6 (six) hours as needed for wheezing or shortness of breath., Disp: 8 g, Rfl: 0   ARIPiprazole (ABILIFY) 10 MG tablet, Take 1 tablet (10 mg total) by mouth daily., Disp: 90 tablet, Rfl: 1   atorvastatin (LIPITOR) 40 MG tablet, Take 1 tablet (40 mg total) by mouth daily., Disp: 90 tablet, Rfl: 3   baclofen (LIORESAL) 10 MG tablet, TAKE ONE TABLET BY MOUTH TWICE A DAY, Disp: 180 tablet, Rfl: 1   cephALEXin (KEFLEX) 500 MG capsule, Take 1 capsule (500 mg total) by mouth 2 (two) times daily for 7 days., Disp: 14 capsule, Rfl: 0   diclofenac (VOLTAREN) 75 MG EC tablet, Take 1 tablet (75 mg total) by mouth 2 (two) times daily., Disp: 180 tablet, Rfl: 3   diclofenac Sodium (VOLTAREN) 1 % GEL, APPLY FOUR GRAMS FOUR TIMES A DAY TO AFFECTED JOINT, Disp: 100 g, Rfl: 2   fluticasone (FLONASE) 50 MCG/ACT nasal spray, Place 2 sprays into both nostrils daily., Disp: 16 g, Rfl: 0   gabapentin (NEURONTIN) 600 MG tablet, Take 1 tablet (600 mg total) by mouth 3 (three) times daily., Disp: 270 tablet, Rfl: 3   hydrochlorothiazide (HYDRODIURIL) 12.5 MG tablet, TAKE 1 TABLET BY MOUTH DAILY, Disp: 90 tablet, Rfl: 0   HYDROcodone-acetaminophen (NORCO) 10-325 MG tablet, Take 1 tablet by mouth every 12 (twelve) hours as needed., Disp: 60 tablet, Rfl: 0   HYDROcodone-acetaminophen (NORCO) 10-325 MG tablet, Take 1 tablet by mouth every 12 (twelve) hours as needed., Disp: 60  tablet, Rfl: 0   [START ON 06/26/2022] HYDROcodone-acetaminophen (NORCO) 10-325 MG tablet, Take 1 tablet by mouth every 12 (twelve) hours as needed., Disp: 60 tablet, Rfl: 0   loratadine (CLARITIN) 10 MG tablet, Take 10 mg by mouth daily. Takes equate brand, Disp: , Rfl:    metroNIDAZOLE (METROGEL) 1 % gel, Apply topically daily., Disp: 60 g, Rfl: 2   montelukast (SINGULAIR) 10 MG tablet, Take 1 tablet (10 mg total) by mouth at bedtime., Disp: 90 tablet, Rfl: 3   nortriptyline (PAMELOR) 50 MG capsule, Take 1 capsule (50 mg total) by mouth 2 (two) times daily., Disp: 180 capsule, Rfl: 1   omeprazole (PRILOSEC) 40 MG capsule, Take 1 capsule (40 mg total) by mouth daily., Disp: 90 capsule, Rfl: 3   OXYTROL FOR WOMEN 3.9 MG/24HR, APPLY 1 PATCH TOPICALLY EVERY 72 HOURS, Disp: , Rfl:    QUEtiapine (SEROQUEL) 100 MG tablet, Take 1 tablet (100 mg total) by mouth 2 (two) times daily., Disp: 180 tablet, Rfl: 1   sertraline (ZOLOFT) 100 MG tablet, Take 1 tablet (100 mg total) by mouth in the morning and at bedtime., Disp: 180 tablet, Rfl: 1   sulfamethoxazole-trimethoprim (BACTRIM DS) 800-160 MG tablet, Take 1 tablet by mouth 2 (two) times daily., Disp: 10 tablet, Rfl: 0   Medications ordered in this encounter:  No orders of the defined types were placed in this encounter.    *If you need refills on other medications prior to  your next appointment, please contact your pharmacy*  Follow-Up: Call back or seek an in-person evaluation if the symptoms worsen or if the condition fails to improve as anticipated.  Other Instructions Please stop the Bactrim giving potential interaction with your BP medications. Increase fluids. Rest. Call your PCP office now to be seen today for exam, Urinalysis and culture to help differentiate bladder infection from a significant IC flare. If they are unable to get you in, use the link below to get scheduled at one of our in-person UC. I would not advise just taking another  antibiotic (Keflex) without getting the needed evaluation due to risk of incomplete treatment leading to more severe illness   If you have been instructed to have an in-person evaluation today at a local Urgent Care facility, please use the link below. It will take you to a list of all of our available [facility], including address, phone number and hours of operation. Please do not delay care.  [facility]  If you or a family member do not have a primary care provider, use the link below to schedule a visit and establish care. When you choose a [facility] primary care physician or advanced practice provider, you gain a long-term partner in health. Find a Primary Care Provider  Learn more about [facility]'s in-office and virtual care options: [facility] - Get Care Now

## 2022-06-03 NOTE — Progress Notes (Signed)
E-Visit for Urinary Problems  We are sorry that you are not feeling well.  Here is how we plan to help!  Based on what you shared with me it looks like you most likely have a simple urinary tract infection.  A UTI (Urinary Tract Infection) is a bacterial infection of the bladder.  Most cases of urinary tract infections are simple to treat but a key part of your care is to encourage you to drink plenty of fluids and watch your symptoms carefully.  I have prescribed Keflex 500 mg twice a day for 7 days.  Your symptoms should gradually improve. Call us if the burning in your urine worsens, you develop worsening fever, back pain or pelvic pain or if your symptoms do not resolve after completing the antibiotic.  Urinary tract infections can be prevented by drinking plenty of water to keep your body hydrated.  Also be sure when you wipe, wipe from front to back and don't hold it in!  If possible, empty your bladder every 4 hours.  HOME CARE Drink plenty of fluids Compete the full course of the antibiotics even if the symptoms resolve Remember, when you need to go.go. Holding in your urine can increase the likelihood of getting a UTI! GET HELP RIGHT AWAY IF: You cannot urinate You get a high fever Worsening back pain occurs You see blood in your urine You feel sick to your stomach or throw up You feel like you are going to pass out  MAKE SURE YOU  Understand these instructions. Will watch your condition. Will get help right away if you are not doing well or get worse.   Thank you for choosing an e-visit.  Your e-visit answers were reviewed by a board certified advanced clinical practitioner to complete your personal care plan. Depending upon the condition, your plan could have included both over the counter or prescription medications.  Please review your pharmacy choice. Make sure the pharmacy is open so you can pick up prescription now. If there is a problem, you may contact your  provider through MyChart messaging and have the prescription routed to another pharmacy.  Your safety is important to us. If you have drug allergies check your prescription carefully.   For the next 24 hours you can use MyChart to ask questions about today's visit, request a non-urgent call back, or ask for a work or school excuse. You will get an email in the next two days asking about your experience. I hope that your e-visit has been valuable and will speed your recovery.   I spent approximately 5 minutes reviewing the patient's history, current symptoms and coordinating their plan of care today  Meds ordered this encounter  Medications   cephALEXin (KEFLEX) 500 MG capsule    Sig: Take 1 capsule (500 mg total) by mouth 2 (two) times daily for 7 days.    Dispense:  14 capsule    Refill:  0    
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
## 2022-06-05 ENCOUNTER — Telehealth: Payer: Medicare HMO | Admitting: Physician Assistant

## 2022-06-05 DIAGNOSIS — J069 Acute upper respiratory infection, unspecified: Secondary | ICD-10-CM | POA: Diagnosis not present

## 2022-06-05 MED ORDER — BENZONATATE 100 MG PO CAPS: 100.0000 mg | ORAL_CAPSULE | Freq: Three times a day (TID) | ORAL | 0 refills | Status: DC | PRN

## 2022-06-06 ENCOUNTER — Telehealth: Payer: Medicare HMO | Admitting: Nurse Practitioner

## 2022-06-06 DIAGNOSIS — J069 Acute upper respiratory infection, unspecified: Secondary | ICD-10-CM | POA: Diagnosis not present

## 2022-06-06 MED ORDER — ALBUTEROL SULFATE HFA 108 (90 BASE) MCG/ACT IN AERS: 2.0000 | INHALATION_SPRAY | Freq: Four times a day (QID) | RESPIRATORY_TRACT | 0 refills | Status: DC | PRN

## 2022-06-07 DIAGNOSIS — J209 Acute bronchitis, unspecified: Secondary | ICD-10-CM | POA: Diagnosis not present

## 2022-06-09 ENCOUNTER — Telehealth: Payer: Medicare HMO | Admitting: Physician Assistant

## 2022-06-09 DIAGNOSIS — J069 Acute upper respiratory infection, unspecified: Secondary | ICD-10-CM

## 2022-06-09 DIAGNOSIS — J9801 Acute bronchospasm: Secondary | ICD-10-CM

## 2022-06-09 MED ORDER — DEXAMETHASONE 4 MG PO TABS: 4.0000 mg | ORAL_TABLET | Freq: Two times a day (BID) | ORAL | 0 refills | Status: AC

## 2022-06-09 MED ORDER — PROMETHAZINE-DM 6.25-15 MG/5ML PO SYRP: 5.0000 mL | ORAL_SOLUTION | Freq: Four times a day (QID) | ORAL | 0 refills | Status: DC | PRN
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
## 2022-06-10 ENCOUNTER — Ambulatory Visit: Payer: Medicare HMO

## 2022-07-21 ENCOUNTER — Ambulatory Visit (INDEPENDENT_AMBULATORY_CARE_PROVIDER_SITE_OTHER): Payer: Medicare HMO

## 2022-07-21 DIAGNOSIS — Z87891 Personal history of nicotine dependence: Secondary | ICD-10-CM | POA: Diagnosis not present

## 2022-07-21 DIAGNOSIS — Z122 Encounter for screening for malignant neoplasm of respiratory organs: Secondary | ICD-10-CM | POA: Diagnosis not present

## 2022-07-22 ENCOUNTER — Other Ambulatory Visit: Payer: Self-pay

## 2022-07-22 DIAGNOSIS — F17210 Nicotine dependence, cigarettes, uncomplicated: Secondary | ICD-10-CM

## 2022-07-22 DIAGNOSIS — Z87891 Personal history of nicotine dependence: Secondary | ICD-10-CM

## 2022-07-22 DIAGNOSIS — Z122 Encounter for screening for malignant neoplasm of respiratory organs: Secondary | ICD-10-CM

## 2022-07-27 ENCOUNTER — Other Ambulatory Visit: Payer: Self-pay | Admitting: Physician Assistant

## 2022-07-27 DIAGNOSIS — Z0289 Encounter for other administrative examinations: Secondary | ICD-10-CM

## 2022-07-27 DIAGNOSIS — G894 Chronic pain syndrome: Secondary | ICD-10-CM

## 2022-07-27 DIAGNOSIS — M2550 Pain in unspecified joint: Secondary | ICD-10-CM

## 2022-07-27 DIAGNOSIS — M797 Fibromyalgia: Secondary | ICD-10-CM

## 2022-07-27 DIAGNOSIS — M5030 Other cervical disc degeneration, unspecified cervical region: Secondary | ICD-10-CM

## 2022-07-27 NOTE — Telephone Encounter (Signed)
Last OV: 04/27/22 Next OV: none on file Last RF: 06/26/22

## 2022-07-28 ENCOUNTER — Encounter: Payer: Self-pay | Admitting: Physician Assistant

## 2022-07-29 ENCOUNTER — Other Ambulatory Visit: Payer: Self-pay | Admitting: Physician Assistant

## 2022-07-29 DIAGNOSIS — K2960 Other gastritis without bleeding: Secondary | ICD-10-CM

## 2022-08-05 ENCOUNTER — Encounter: Payer: Self-pay | Admitting: General Practice

## 2022-08-11 DIAGNOSIS — L820 Inflamed seborrheic keratosis: Secondary | ICD-10-CM | POA: Diagnosis not present

## 2022-08-11 DIAGNOSIS — L818 Other specified disorders of pigmentation: Secondary | ICD-10-CM | POA: Diagnosis not present

## 2022-08-11 DIAGNOSIS — L821 Other seborrheic keratosis: Secondary | ICD-10-CM | POA: Diagnosis not present

## 2022-08-23 ENCOUNTER — Other Ambulatory Visit: Payer: Self-pay | Admitting: Physician Assistant

## 2022-08-27 DIAGNOSIS — F3181 Bipolar II disorder: Secondary | ICD-10-CM | POA: Diagnosis not present

## 2022-09-22 DIAGNOSIS — F3181 Bipolar II disorder: Secondary | ICD-10-CM | POA: Diagnosis not present

## 2022-09-24 ENCOUNTER — Other Ambulatory Visit: Payer: Self-pay | Admitting: Physician Assistant

## 2022-09-24 DIAGNOSIS — N3281 Overactive bladder: Secondary | ICD-10-CM

## 2022-10-19 ENCOUNTER — Other Ambulatory Visit: Payer: Self-pay | Admitting: Physician Assistant

## 2022-10-19 DIAGNOSIS — F419 Anxiety disorder, unspecified: Secondary | ICD-10-CM

## 2022-10-19 DIAGNOSIS — F332 Major depressive disorder, recurrent severe without psychotic features: Secondary | ICD-10-CM

## 2022-10-19 DIAGNOSIS — F3181 Bipolar II disorder: Secondary | ICD-10-CM

## 2022-10-20 DIAGNOSIS — F3181 Bipolar II disorder: Secondary | ICD-10-CM | POA: Diagnosis not present

## 2022-10-23 ENCOUNTER — Other Ambulatory Visit: Payer: Self-pay | Admitting: Physician Assistant

## 2022-10-23 DIAGNOSIS — K2960 Other gastritis without bleeding: Secondary | ICD-10-CM

## 2022-10-23 NOTE — Telephone Encounter (Signed)
Lvm for patient to call and schedule an appointment for further refills- tvt

## 2022-11-16 DIAGNOSIS — F3181 Bipolar II disorder: Secondary | ICD-10-CM | POA: Diagnosis not present

## 2022-11-20 ENCOUNTER — Other Ambulatory Visit: Payer: Self-pay | Admitting: Physician Assistant

## 2022-12-15 DIAGNOSIS — F3181 Bipolar II disorder: Secondary | ICD-10-CM | POA: Diagnosis not present

## 2023-01-04 DIAGNOSIS — F3181 Bipolar II disorder: Secondary | ICD-10-CM | POA: Diagnosis not present

## 2023-01-17 IMAGING — MG MM DIGITAL SCREENING BILAT W/ TOMO AND CAD
8 series · 8 of 24 positions shown · non-contrast
Comparison: Previous exam(s).

CLINICAL DATA: Screening.

EXAM:
DIGITAL SCREENING BILATERAL MAMMOGRAM WITH TOMOSYNTHESIS AND CAD
TECHNIQUE: Bilateral screening digital craniocaudal and mediolateral oblique
mammograms were obtained. Bilateral screening digital breast
tomosynthesis was performed. The images were evaluated with
computer-aided detection.

[L MLO synth-2D]
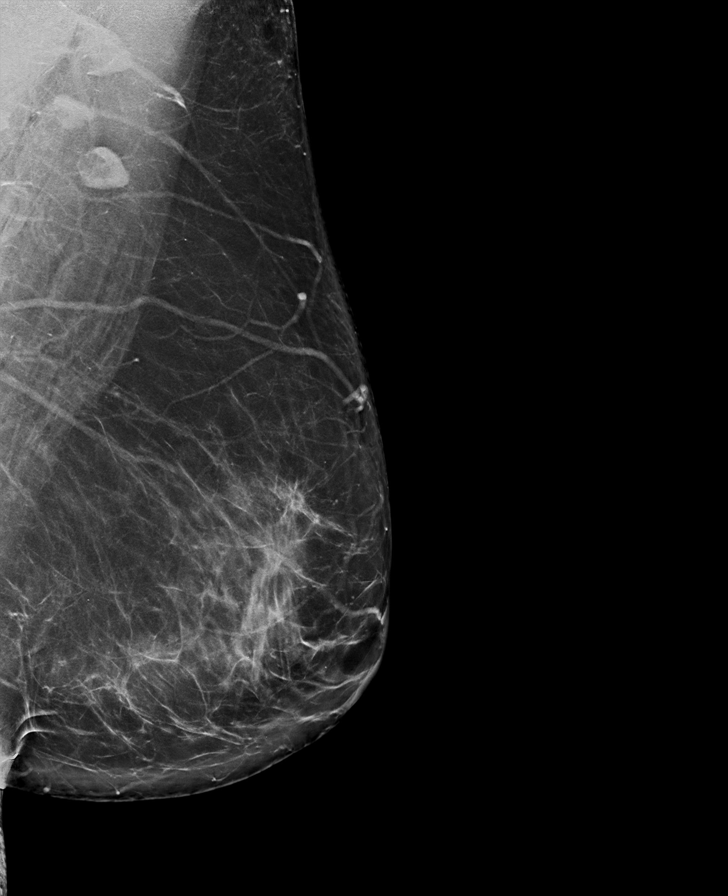

[R MLO synth-2D]
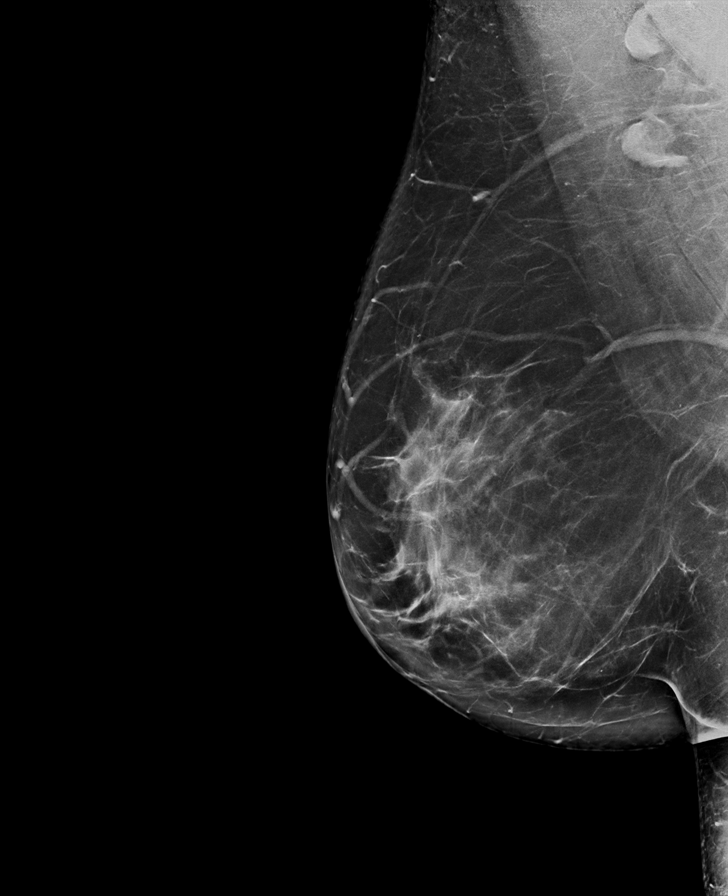

[R CC synth-2D]
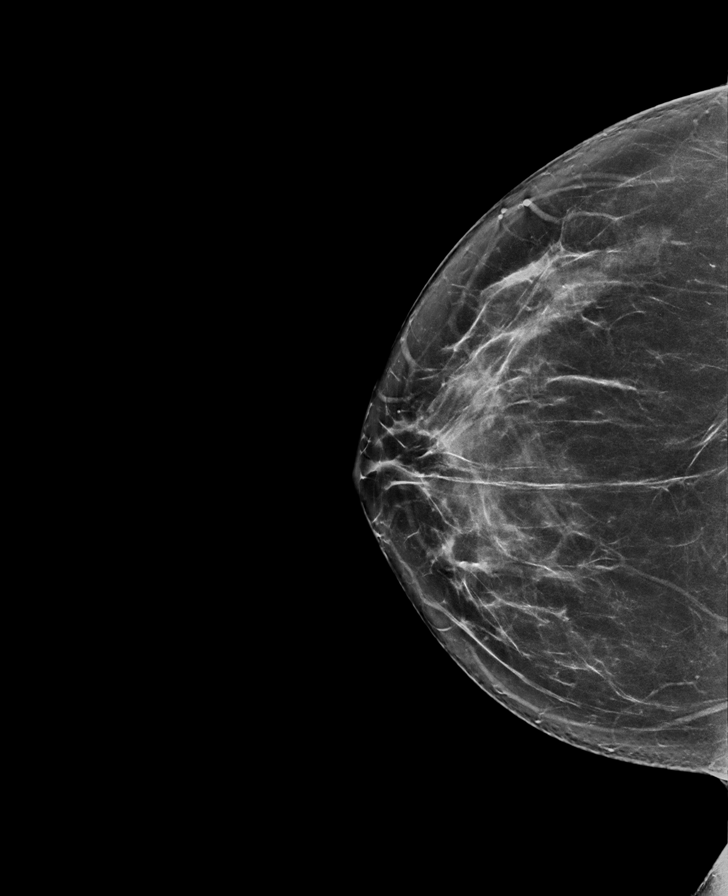

[L CC synth-2D]
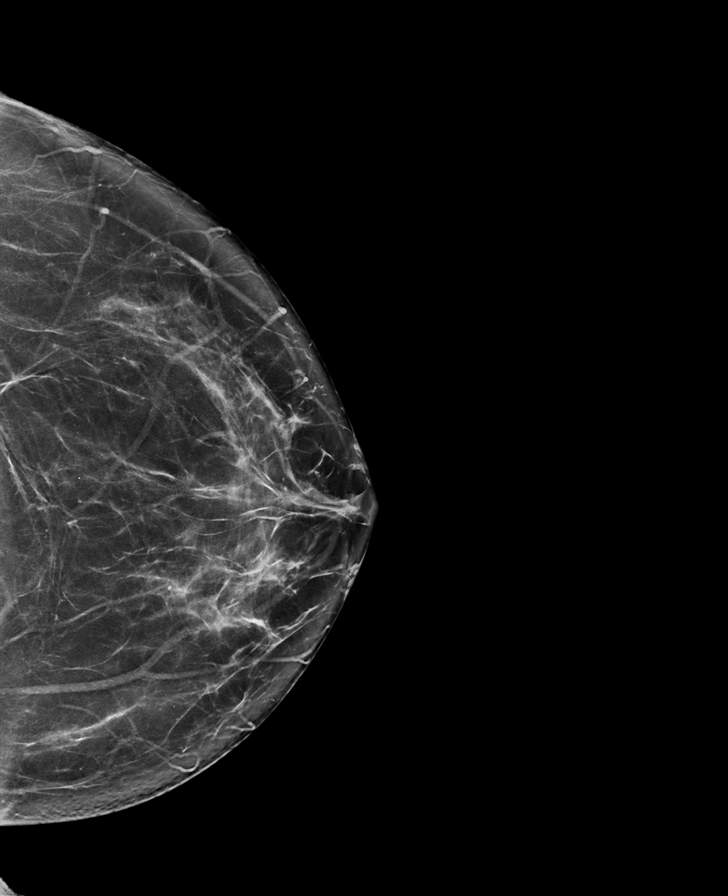

[R CC tomo · tomo slice 41/82.0]
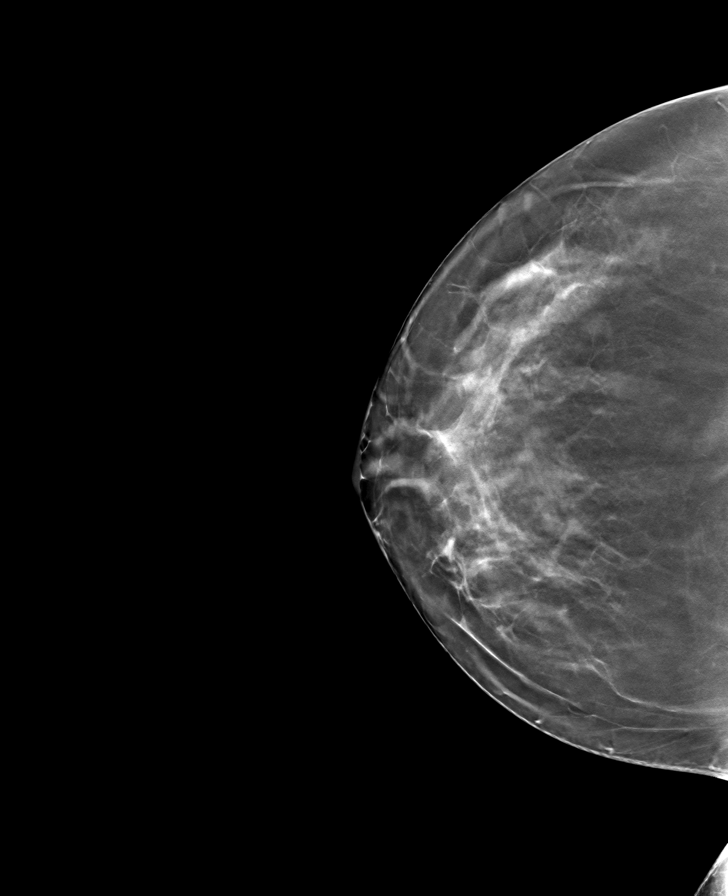

[L MLO tomo · tomo slice 47/94.0]
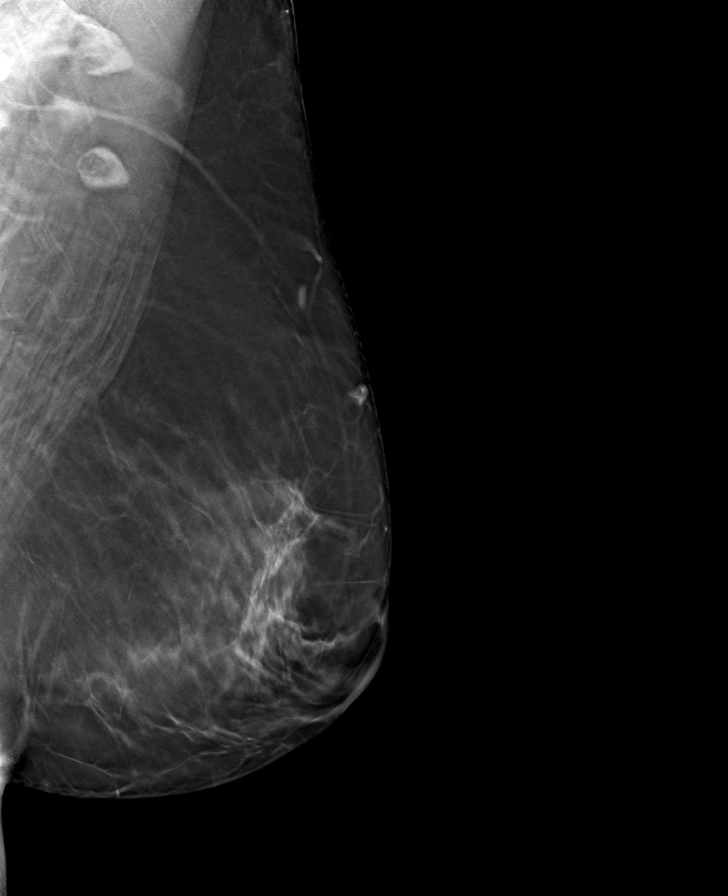

[L CC tomo · tomo slice 41/81.0]
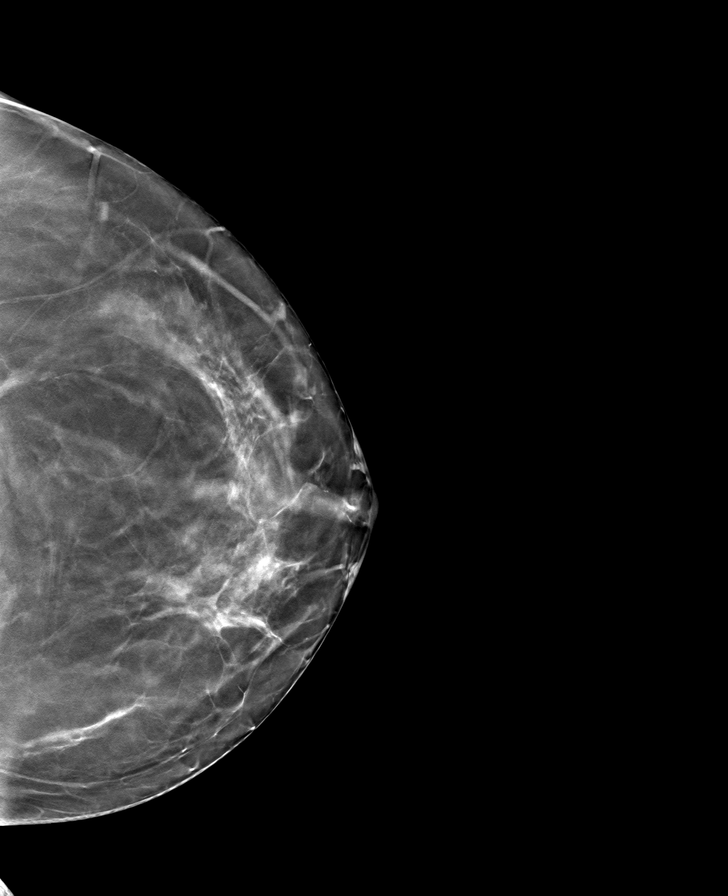

[R MLO tomo · tomo slice 47/93.0]
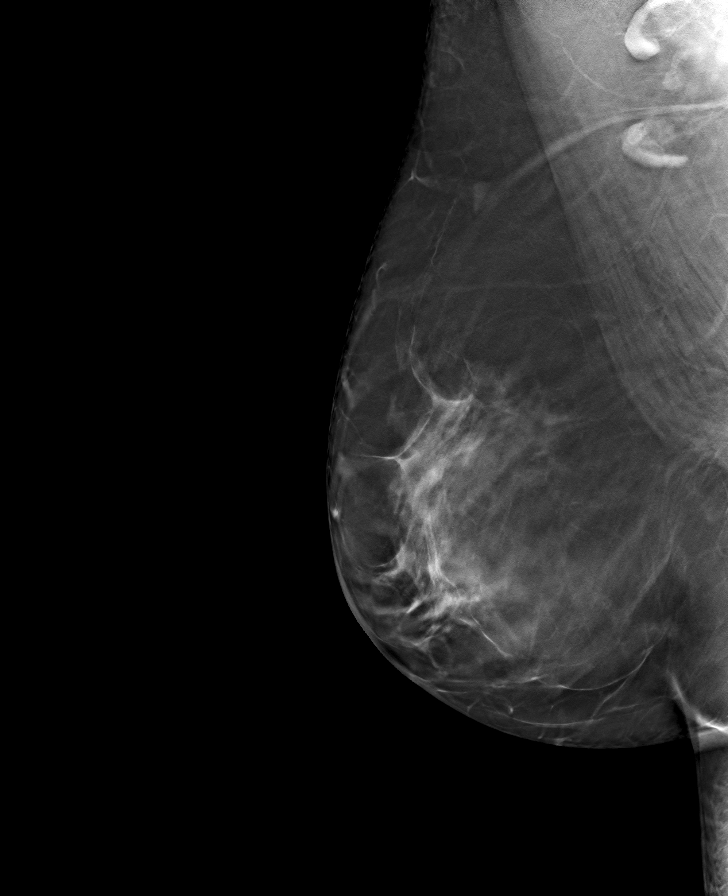

[8 of 24 positions shown; findings below may reference images not displayed]

ACR Breast Density Category c: The breast tissue is heterogeneously
dense, which may obscure small masses.
FINDINGS: There are no findings suspicious for malignancy.
IMPRESSION: No mammographic evidence of malignancy. A result letter of this
screening mammogram will be mailed directly to the patient.

RECOMMENDATION:
Screening mammogram in one year. (Code:Q3-W-BC3)

BI-RADS CATEGORY  1: Negative.

## 2023-01-25 ENCOUNTER — Ambulatory Visit (INDEPENDENT_AMBULATORY_CARE_PROVIDER_SITE_OTHER): Payer: Medicare HMO | Admitting: Physician Assistant

## 2023-01-25 VITALS — BP 124/70 | HR 97 | Ht 63.0 in | Wt 178.0 lb

## 2023-01-25 DIAGNOSIS — Z23 Encounter for immunization: Secondary | ICD-10-CM | POA: Diagnosis not present

## 2023-01-25 DIAGNOSIS — Z6831 Body mass index (BMI) 31.0-31.9, adult: Secondary | ICD-10-CM

## 2023-01-25 DIAGNOSIS — E781 Pure hyperglyceridemia: Secondary | ICD-10-CM | POA: Diagnosis not present

## 2023-01-25 DIAGNOSIS — Z13220 Encounter for screening for lipoid disorders: Secondary | ICD-10-CM | POA: Diagnosis not present

## 2023-01-25 DIAGNOSIS — Z0000 Encounter for general adult medical examination without abnormal findings: Secondary | ICD-10-CM | POA: Diagnosis not present

## 2023-01-25 DIAGNOSIS — E785 Hyperlipidemia, unspecified: Secondary | ICD-10-CM | POA: Diagnosis not present

## 2023-01-25 DIAGNOSIS — G4700 Insomnia, unspecified: Secondary | ICD-10-CM

## 2023-01-25 DIAGNOSIS — G894 Chronic pain syndrome: Secondary | ICD-10-CM | POA: Diagnosis not present

## 2023-01-25 DIAGNOSIS — Z1231 Encounter for screening mammogram for malignant neoplasm of breast: Secondary | ICD-10-CM

## 2023-01-25 DIAGNOSIS — M329 Systemic lupus erythematosus, unspecified: Secondary | ICD-10-CM | POA: Diagnosis not present

## 2023-01-25 DIAGNOSIS — N3281 Overactive bladder: Secondary | ICD-10-CM | POA: Diagnosis not present

## 2023-01-25 DIAGNOSIS — Z131 Encounter for screening for diabetes mellitus: Secondary | ICD-10-CM

## 2023-01-25 DIAGNOSIS — M797 Fibromyalgia: Secondary | ICD-10-CM | POA: Diagnosis not present

## 2023-01-25 DIAGNOSIS — K2960 Other gastritis without bleeding: Secondary | ICD-10-CM | POA: Diagnosis not present

## 2023-01-25 DIAGNOSIS — R062 Wheezing: Secondary | ICD-10-CM

## 2023-01-25 DIAGNOSIS — E6609 Other obesity due to excess calories: Secondary | ICD-10-CM

## 2023-01-25 DIAGNOSIS — E079 Disorder of thyroid, unspecified: Secondary | ICD-10-CM

## 2023-01-25 DIAGNOSIS — M069 Rheumatoid arthritis, unspecified: Secondary | ICD-10-CM | POA: Diagnosis not present

## 2023-01-25 MED ORDER — NORTRIPTYLINE HCL 50 MG PO CAPS: 50.0000 mg | ORAL_CAPSULE | Freq: Two times a day (BID) | ORAL | 1 refills | Status: DC

## 2023-01-25 MED ORDER — OMEPRAZOLE 40 MG PO CPDR: 40.0000 mg | DELAYED_RELEASE_CAPSULE | Freq: Every day | ORAL | 3 refills | Status: DC

## 2023-01-25 MED ORDER — MONTELUKAST SODIUM 10 MG PO TABS: 10.0000 mg | ORAL_TABLET | Freq: Every day | ORAL | 3 refills | Status: DC

## 2023-01-25 MED ORDER — GABAPENTIN 600 MG PO TABS: 600.0000 mg | ORAL_TABLET | Freq: Three times a day (TID) | ORAL | 3 refills | Status: DC
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
## 2023-01-25 NOTE — Patient Instructions (Signed)

## 2023-01-26 LAB — LIPID PANEL W/REFLEX DIRECT LDL
Cholesterol: 320 mg/dL — ABNORMAL HIGH (ref <200)
HDL: 63 mg/dL (ref >=50)
LDL Cholesterol (Calc): 206 mg/dL (calc) — ABNORMAL HIGH
Non-HDL Cholesterol (Calc): 257 mg/dL (calc) — ABNORMAL HIGH (ref <130)
Total CHOL/HDL Ratio: 5.1 (calc) — ABNORMAL HIGH (ref <5.0)
Triglycerides: 298 mg/dL — ABNORMAL HIGH (ref <150)

## 2023-01-26 LAB — CBC WITH DIFFERENTIAL/PLATELET
Absolute Monocytes: 519 cells/uL (ref 200–950)
Basophils Absolute: 68 cells/uL (ref 0–200)
Basophils Relative: 0.8 %
Eosinophils Absolute: 383 cells/uL (ref 15–500)
Eosinophils Relative: 4.5 %
HCT: 42 % (ref 35.0–45.0)
Hemoglobin: 14.1 g/dL (ref 11.7–15.5)
Lymphs Abs: 3120 cells/uL (ref 850–3900)
MCH: 29.9 pg (ref 27.0–33.0)
MCHC: 33.6 g/dL (ref 32.0–36.0)
MCV: 89.2 fL (ref 80.0–100.0)
MPV: 12.2 fL (ref 7.5–12.5)
Monocytes Relative: 6.1 %
Neutro Abs: 4412 cells/uL (ref 1500–7800)
Neutrophils Relative %: 51.9 %
Platelets: 265 10*3/uL (ref 140–400)
RBC: 4.71 10*6/uL (ref 3.80–5.10)
RDW: 13.2 % (ref 11.0–15.0)
Total Lymphocyte: 36.7 %
WBC: 8.5 10*3/uL (ref 3.8–10.8)

## 2023-01-26 LAB — COMPLETE METABOLIC PANEL WITH GFR
AG Ratio: 1.2 (calc) (ref 1.0–2.5)
ALT: 79 U/L — ABNORMAL HIGH (ref 6–29)
AST: 28 U/L (ref 10–35)
Albumin: 4.2 g/dL (ref 3.6–5.1)
Alkaline phosphatase (APISO): 104 U/L (ref 37–153)
BUN: 14 mg/dL (ref 7–25)
CO2: 28 mmol/L (ref 20–32)
Calcium: 9.5 mg/dL (ref 8.6–10.4)
Chloride: 97 mmol/L — ABNORMAL LOW (ref 98–110)
Creat: 0.8 mg/dL (ref 0.50–1.03)
Globulin: 3.5 g/dL (calc) (ref 1.9–3.7)
Glucose, Bld: 82 mg/dL (ref 65–99)
Potassium: 4 mmol/L (ref 3.5–5.3)
Sodium: 134 mmol/L — ABNORMAL LOW (ref 135–146)
Total Bilirubin: 0.4 mg/dL (ref 0.2–1.2)
Total Protein: 7.7 g/dL (ref 6.1–8.1)
eGFR: 85 mL/min/{1.73_m2} (ref >=60)

## 2023-01-26 LAB — TSH: TSH: 2.31 mIU/L (ref 0.40–4.50)

## 2023-01-27 ENCOUNTER — Other Ambulatory Visit: Payer: Self-pay | Admitting: Neurology

## 2023-01-27 DIAGNOSIS — R748 Abnormal levels of other serum enzymes: Secondary | ICD-10-CM

## 2023-01-27 MED ORDER — ATORVASTATIN CALCIUM 40 MG PO TABS: 40.0000 mg | ORAL_TABLET | Freq: Every day | ORAL | 3 refills | Status: DC

## 2023-01-27 NOTE — Progress Notes (Signed)
Debbie Hancock,   WBC and hemoglobin look good.  Thyroid looks great.  Sodium up.  Kidney looks good.  One liver enzyme is up. Avoid alcohol. Recheck in 4 weeks.  HDL, looks great.  LDL very elevated and almost doubled since 1 year ago.   10 year risk is 7.6 percent. I would strongly advised to start a statin.   Marland Kitchen.The 10-year ASCVD risk score (Arnett DK, et al., 2019) is: 7.6%   Values used to calculate the score:     Age: 57 years     Sex: Female     Is Non-Hispanic African American: No     Diabetic: No     Tobacco smoker: Yes     Systolic Blood Pressure: [id number] mmHg     Is BP treated: No     HDL Cholesterol: 63 mg/dL     Total Cholesterol: 320 mg/dL

## 2023-01-27 NOTE — Progress Notes (Signed)
Sent!

## 2023-01-29 DIAGNOSIS — Z23 Encounter for immunization: Secondary | ICD-10-CM | POA: Insufficient documentation

## 2023-02-01 ENCOUNTER — Encounter: Payer: Self-pay | Admitting: Physician Assistant

## 2023-02-02 ENCOUNTER — Telehealth: Payer: Self-pay | Admitting: Neurology

## 2023-02-02 ENCOUNTER — Telehealth: Payer: Self-pay | Admitting: Physician Assistant

## 2023-02-02 NOTE — Telephone Encounter (Signed)
Called patient to schedule Medicare Annual Wellness Visit (AWV). Left message for patient to call back and schedule Medicare Annual Wellness Visit (AWV).  Last date of AWV: Never  Please schedule an appointment at any time with Nurse Health Advisor.  If any questions, please contact me at [phone].  Thank you ,  Lin Givens Patient Access Advocate II Direct Dial: [phone]

## 2023-02-02 NOTE — Telephone Encounter (Signed)
Patient called and LVM trying to schedule Medicare Wellness visit. Please call her back.

## 2023-02-03 ENCOUNTER — Ambulatory Visit (INDEPENDENT_AMBULATORY_CARE_PROVIDER_SITE_OTHER): Payer: Medicare HMO

## 2023-02-03 ENCOUNTER — Ambulatory Visit (INDEPENDENT_AMBULATORY_CARE_PROVIDER_SITE_OTHER): Payer: Medicare HMO | Admitting: Physician Assistant

## 2023-02-03 ENCOUNTER — Encounter: Payer: Self-pay | Admitting: Physician Assistant

## 2023-02-03 VITALS — BP 127/76 | HR 100 | Ht 63.0 in | Wt 180.0 lb

## 2023-02-03 DIAGNOSIS — W19XXXA Unspecified fall, initial encounter: Secondary | ICD-10-CM

## 2023-02-03 DIAGNOSIS — M069 Rheumatoid arthritis, unspecified: Secondary | ICD-10-CM | POA: Diagnosis not present

## 2023-02-03 DIAGNOSIS — S8992XA Unspecified injury of left lower leg, initial encounter: Secondary | ICD-10-CM

## 2023-02-03 DIAGNOSIS — M329 Systemic lupus erythematosus, unspecified: Secondary | ICD-10-CM | POA: Diagnosis not present

## 2023-02-03 DIAGNOSIS — Z043 Encounter for examination and observation following other accident: Secondary | ICD-10-CM | POA: Diagnosis not present

## 2023-02-03 DIAGNOSIS — M7989 Other specified soft tissue disorders: Secondary | ICD-10-CM | POA: Diagnosis not present

## 2023-02-03 DIAGNOSIS — S0992XA Unspecified injury of nose, initial encounter: Secondary | ICD-10-CM

## 2023-02-03 MED ORDER — IBUPROFEN 800 MG PO TABS: 800.0000 mg | ORAL_TABLET | Freq: Three times a day (TID) | ORAL | 0 refills | Status: DC | PRN

## 2023-02-03 NOTE — Patient Instructions (Addendum)
Lots of ice and ibuprofen  Get xrays today  Nasal Fracture A nasal fracture is a break or crack in the bones of the nose or the tissue that helps to form the nose (cartilage). Minor breaks do not require treatment and usually heal on their own after about one month. Serious breaks may require treatment that could include surgery. What are the causes? A nasal fracture is usually caused by the strong force of a direct hit to the nose (blunt injury). This type of injury often occurs from: Playing a contact sport. Being involved in a car accident. Falling. Getting punched in the face. What are the signs or symptoms? Symptoms of this condition include: Pain. Swelling of the nose. Bleeding from the nose. Bruising around the nose or bruising around the eyes (black eyes). Crooked appearance of the nose. How is this diagnosed? This condition may be diagnosed based on a physical exam. During the exam, the health care provider will: Gently feel the nose for signs of broken bones. Look inside the nostrils to check if there is a blood-filled swelling on the dividing wall between the nostrils (septal hematoma). An X-ray of the nose may be taken. Sometimes, an X-ray may not show a nasal fracture even when one is present. In some cases, X-rays or a CT scan may be taken again 1-5 days later after the swelling has gone down. How is this treated? Treatment for this condition depends on the severity of the injury. Minor fractures that have not caused deformity often do not require treatment. They may heal on their own. For more serious fractures that have caused bones to move out of position, treatment may involve one of the following: Repositioning the bones without surgery. The health care provider may be able to do this in his or her office after you are given medicine to numb the nasal area (local anesthetic). Surgery. If surgery is needed, it will be done after the swelling is gone. Surgery will  stabilize and align the fracture. Follow these instructions at home:     Activity Return to your normal activities as told by your health care provider. Ask your health care provider what activities are safe for you. Do not play contact sports for 3-4 weeks or as told by your health care provider. Managing pain and swelling If directed, put ice on the injured area. To do this: Put ice in a plastic bag. Place a towel between your skin and the bag. Leave the ice on for 20 minutes, 2-3 times a day. Take off the ice if your skin turns bright red. This is very important. If you cannot feel pain, heat, or cold, you have a greater risk of damage to the area. General instructions Take over-the-counter and prescription medicines only as told by your health care provider. If your nose starts to bleed, sit in an upright position while you squeeze the soft parts of your nose against the dividing wall between your nostrils (septum) for 10 minutes. Try to avoid blowing your nose. Keep all follow-up visits. This is important. Contact a health care provider if: Your pain increases or becomes severe. You continue to have nosebleeds. The shape of your nose does not return to normal within 5 days. You have pus draining out of your nose. Get help right away if: You have bleeding from your nose that does not stop after you pinch your nostrils closed for 20 minutes and keep ice on your nose. You have clear fluid draining out  of your nose. You notice swelling near the septum inside the nose. This swelling is a septal hematoma that must be drained to help prevent infection. You have difficulty moving your eyes. You have repeated vomiting. These symptoms may be an emergency. Get help right away. Call 911. Do not wait to see if the symptoms will go away. Do not drive yourself to the hospital. Summary A nasal fracture is a break or crack in the bones or cartilage of the nose. The fracture is usually caused  by a blunt injury to the nose. Symptoms include pain, swelling, and facial bruising. Nasal fractures may heal on their own, or your health care provider may need to move the bones back into proper position. In some cases, surgery may be needed. This information is not intended to replace advice given to you by your health care provider. Make sure you discuss any questions you have with your health care provider. Document Revised: 07/09/2021 Document Reviewed: 07/09/2021 Elsevier Patient Education  [place].
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
## 2023-02-03 NOTE — Progress Notes (Signed)
Great news no sign of nasal fracture. Ok to use ibuprofen up to 3 times a day for next few days. Ice are frequently.

## 2023-02-03 NOTE — Telephone Encounter (Signed)
Patient scheduled for 02/26/2023 @ 8:00 Tvt

## 2023-02-03 NOTE — Progress Notes (Signed)
No fracture of knee just contusion. Ibuprofen, ice, rest, elevation. Stay off of it for next week.

## 2023-02-24 ENCOUNTER — Ambulatory Visit (INDEPENDENT_AMBULATORY_CARE_PROVIDER_SITE_OTHER): Payer: Medicare HMO

## 2023-02-24 DIAGNOSIS — Z1231 Encounter for screening mammogram for malignant neoplasm of breast: Secondary | ICD-10-CM | POA: Diagnosis not present

## 2023-02-25 NOTE — Progress Notes (Signed)
Normal mammogram. Follow up in 1 year.

## 2023-02-26 ENCOUNTER — Ambulatory Visit (INDEPENDENT_AMBULATORY_CARE_PROVIDER_SITE_OTHER): Payer: Medicare HMO | Admitting: Physician Assistant

## 2023-02-26 DIAGNOSIS — Z0000 Encounter for general adult medical examination without abnormal findings: Secondary | ICD-10-CM | POA: Diagnosis not present

## 2023-02-26 NOTE — Patient Instructions (Signed)
[facility] Maintenance Summary and Written Plan of Care  Debbie Hancock ,  Thank you for allowing me to perform your Medicare Annual Wellness Visit and for your ongoing commitment to your health.   Health Maintenance & Immunization History Health Maintenance  Topic Date Due   PAP SMEAR-Modifier  04/28/2023   COVID-19 Vaccine (5 - 2023-24 season) 03/14/2023 (Originally 08/14/2022)   Lung Cancer Screening  07/22/2023   MAMMOGRAM  02/24/2024   Medicare Annual Wellness (AWV)  02/26/2024   COLONOSCOPY (Pts 45-48yrs Insurance coverage will need to be confirmed)  06/04/2025   DTaP/Tdap/Td (2 - Td or Tdap) 08/08/2028   INFLUENZA VACCINE  Completed   Hepatitis C Screening  Completed   HIV Screening  Completed   Zoster Vaccines- Shingrix  Completed   HPV VACCINES  Aged Out   Immunization History  Administered Date(s) Administered   Influenza,inj,Quad PF,6+ Mos 08/19/2018, 08/14/2020, 10/13/2021, 01/25/2023   PFIZER(Purple Top)SARS-COV-2 Vaccination 07/16/2020, 08/06/2020, 03/22/2021   PNEUMOCOCCAL CONJUGATE-20 07/19/2021   Pfizer Covid-19 Vaccine Bivalent Booster 73yrs & up 11/12/2021   Tdap 08/08/2018   Zoster Recombinat (Shingrix) 07/19/2021, 10/17/2021    These are the patient goals that we discussed:  Goals Addressed               This Visit's Progress     Patient Stated (pt-stated)        Patient stated that she would like to lower cholesterol.          This is a list of Health Maintenance Items that are overdue or due now: Health Maintenance Due  Topic Date Due   PAP SMEAR-Modifier  04/28/2023     Orders/Referrals Placed Today: No orders of the defined types were placed in this encounter.  (Contact our referral department at [phone] if you have not spoken with someone about your referral appointment within the next 5 days)    Follow-up Plan Follow-up with Donella Stade, PA-C as planned She will call and schedule her pap smear  appointment. Medicare wellness visit in one year.  Patient will access AVS on my chart.       Health Maintenance, Female Adopting a healthy lifestyle and getting preventive care are important in promoting health and wellness. Ask your health care provider about: The right schedule for you to have regular tests and exams. Things you can do on your own to prevent diseases and keep yourself healthy. What should I know about diet, weight, and exercise? Eat a healthy diet  Eat a diet that includes plenty of vegetables, fruits, low-fat dairy products, and lean protein. Do not eat a lot of foods that are high in solid fats, added sugars, or sodium. Maintain a healthy weight Body mass index (BMI) is used to identify weight problems. It estimates body fat based on height and weight. Your health care provider can help determine your BMI and help you achieve or maintain a healthy weight. Get regular exercise Get regular exercise. This is one of the most important things you can do for your health. Most adults should: Exercise for at least 150 minutes each week. The exercise should increase your heart rate and make you sweat (moderate-intensity exercise). Do strengthening exercises at least twice a week. This is in addition to the moderate-intensity exercise. Spend less time sitting. Even light physical activity can be beneficial. Watch cholesterol and blood lipids Have your blood tested for lipids and cholesterol at 59 years of age, then have this test every 5  years. Have your cholesterol levels checked more often if: Your lipid or cholesterol levels are high. You are older than 59 years of age. You are at high risk for heart disease. What should I know about cancer screening? Depending on your health history and family history, you may need to have cancer screening at various ages. This may include screening for: Breast cancer. Cervical cancer. Colorectal cancer. Skin cancer. Lung  cancer. What should I know about heart disease, diabetes, and high blood pressure? Blood pressure and heart disease High blood pressure causes heart disease and increases the risk of stroke. This is more likely to develop in people who have high blood pressure readings or are overweight. Have your blood pressure checked: Every 3-5 years if you are 26-34 years of age. Every year if you are 61 years old or older. Diabetes Have regular diabetes screenings. This checks your fasting blood sugar level. Have the screening done: Once every three years after age 61 if you are at a normal weight and have a low risk for diabetes. More often and at a younger age if you are overweight or have a high risk for diabetes. What should I know about preventing infection? Hepatitis B If you have a higher risk for hepatitis B, you should be screened for this virus. Talk with your health care provider to find out if you are at risk for hepatitis B infection. Hepatitis C Testing is recommended for: Everyone born from [birth year] through 1967. Anyone with known risk factors for hepatitis C. Sexually transmitted infections (STIs) Get screened for STIs, including gonorrhea and chlamydia, if: You are sexually active and are younger than 59 years of age. You are older than 59 years of age and your health care provider tells you that you are at risk for this type of infection. Your sexual activity has changed since you were last screened, and you are at increased risk for chlamydia or gonorrhea. Ask your health care provider if you are at risk. Ask your health care provider about whether you are at high risk for HIV. Your health care provider may recommend a prescription medicine to help prevent HIV infection. If you choose to take medicine to prevent HIV, you should first get tested for HIV. You should then be tested every 3 months for as long as you are taking the medicine. Pregnancy If you are about to stop having your  period (premenopausal) and you may become pregnant, seek counseling before you get pregnant. Take 400 to 800 micrograms (mcg) of folic acid every day if you become pregnant. Ask for birth control (contraception) if you want to prevent pregnancy. Osteoporosis and menopause Osteoporosis is a disease in which the bones lose minerals and strength with aging. This can result in bone fractures. If you are 63 years old or older, or if you are at risk for osteoporosis and fractures, ask your health care provider if you should: Be screened for bone loss. Take a calcium or vitamin D supplement to lower your risk of fractures. Be given hormone replacement therapy (HRT) to treat symptoms of menopause. Follow these instructions at home: Alcohol use Do not drink alcohol if: Your health care provider tells you not to drink. You are pregnant, may be pregnant, or are planning to become pregnant. If you drink alcohol: Limit how much you have to: 0-1 drink a day. Know how much alcohol is in your drink. In the [place] one drink equals one 12 oz bottle of beer (355 mL), one  5 oz glass of wine (148 mL), or one 1 oz glass of hard liquor (44 mL). Lifestyle Do not use any products that contain nicotine or tobacco. These products include cigarettes, chewing tobacco, and vaping devices, such as e-cigarettes. If you need help quitting, ask your health care provider. Do not use street drugs. Do not share needles. Ask your health care provider for help if you need support or information about quitting drugs. General instructions Schedule regular health, dental, and eye exams. Stay current with your vaccines. Tell your health care provider if: You often feel depressed. You have ever been abused or do not feel safe at home. Summary Adopting a healthy lifestyle and getting preventive care are important in promoting health and wellness. Follow your health care provider's instructions about healthy diet, exercising, and  getting tested or screened for diseases. Follow your health care provider's instructions on monitoring your cholesterol and blood pressure. This information is not intended to replace advice given to you by your health care provider. Make sure you discuss any questions you have with your health care provider. Document Revised: 04/21/2021 Document Reviewed: 04/21/2021 Elsevier Patient Education  [place].
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
## 2023-03-17 ENCOUNTER — Other Ambulatory Visit: Payer: Self-pay | Admitting: Physician Assistant

## 2023-03-17 DIAGNOSIS — S0992XA Unspecified injury of nose, initial encounter: Secondary | ICD-10-CM

## 2023-03-17 DIAGNOSIS — W19XXXA Unspecified fall, initial encounter: Secondary | ICD-10-CM

## 2023-03-17 DIAGNOSIS — S8992XA Unspecified injury of left lower leg, initial encounter: Secondary | ICD-10-CM

## 2023-04-06 DIAGNOSIS — F3181 Bipolar II disorder: Secondary | ICD-10-CM | POA: Diagnosis not present

## 2023-05-13 DIAGNOSIS — D485 Neoplasm of uncertain behavior of skin: Secondary | ICD-10-CM | POA: Diagnosis not present

## 2023-05-13 DIAGNOSIS — L820 Inflamed seborrheic keratosis: Secondary | ICD-10-CM | POA: Diagnosis not present

## 2023-05-14 ENCOUNTER — Other Ambulatory Visit: Payer: Self-pay | Admitting: Acute Care

## 2023-05-14 DIAGNOSIS — F17210 Nicotine dependence, cigarettes, uncomplicated: Secondary | ICD-10-CM

## 2023-05-14 DIAGNOSIS — Z122 Encounter for screening for malignant neoplasm of respiratory organs: Secondary | ICD-10-CM

## 2023-05-14 DIAGNOSIS — Z87891 Personal history of nicotine dependence: Secondary | ICD-10-CM

## 2023-06-29 DIAGNOSIS — F3181 Bipolar II disorder: Secondary | ICD-10-CM | POA: Diagnosis not present

## 2023-07-23 ENCOUNTER — Ambulatory Visit: Payer: Medicare HMO

## 2023-07-30 ENCOUNTER — Ambulatory Visit (INDEPENDENT_AMBULATORY_CARE_PROVIDER_SITE_OTHER): Payer: Medicare HMO

## 2023-07-30 DIAGNOSIS — F17210 Nicotine dependence, cigarettes, uncomplicated: Secondary | ICD-10-CM | POA: Diagnosis not present

## 2023-07-30 DIAGNOSIS — Z122 Encounter for screening for malignant neoplasm of respiratory organs: Secondary | ICD-10-CM

## 2023-07-30 DIAGNOSIS — Z87891 Personal history of nicotine dependence: Secondary | ICD-10-CM

## 2023-08-05 ENCOUNTER — Other Ambulatory Visit: Payer: Self-pay | Admitting: Acute Care

## 2023-08-05 DIAGNOSIS — Z122 Encounter for screening for malignant neoplasm of respiratory organs: Secondary | ICD-10-CM

## 2023-08-05 DIAGNOSIS — Z87891 Personal history of nicotine dependence: Secondary | ICD-10-CM

## 2023-08-05 DIAGNOSIS — F17210 Nicotine dependence, cigarettes, uncomplicated: Secondary | ICD-10-CM

## 2023-08-18 ENCOUNTER — Other Ambulatory Visit (HOSPITAL_COMMUNITY)
Admission: RE | Admit: 2023-08-18 | Discharge: 2023-08-18 | Disposition: A | Discharge status: Home / Self Care | Payer: Medicare HMO | Source: Ambulatory Visit | Attending: Physician Assistant | Admitting: Physician Assistant

## 2023-08-18 ENCOUNTER — Ambulatory Visit (INDEPENDENT_AMBULATORY_CARE_PROVIDER_SITE_OTHER): Payer: Medicare HMO | Admitting: Physician Assistant

## 2023-08-18 VITALS — BP 121/65 | HR 101 | Ht 63.0 in | Wt 186.0 lb

## 2023-08-18 DIAGNOSIS — Z1151 Encounter for screening for human papillomavirus (HPV): Secondary | ICD-10-CM | POA: Insufficient documentation

## 2023-08-18 DIAGNOSIS — N3281 Overactive bladder: Secondary | ICD-10-CM

## 2023-08-18 DIAGNOSIS — Z124 Encounter for screening for malignant neoplasm of cervix: Secondary | ICD-10-CM | POA: Diagnosis not present

## 2023-08-18 DIAGNOSIS — Z6832 Body mass index (BMI) 32.0-32.9, adult: Secondary | ICD-10-CM | POA: Diagnosis not present

## 2023-08-18 DIAGNOSIS — E781 Pure hyperglyceridemia: Secondary | ICD-10-CM | POA: Diagnosis not present

## 2023-08-18 DIAGNOSIS — Z01419 Encounter for gynecological examination (general) (routine) without abnormal findings: Secondary | ICD-10-CM | POA: Insufficient documentation

## 2023-08-18 DIAGNOSIS — E6609 Other obesity due to excess calories: Secondary | ICD-10-CM

## 2023-08-18 DIAGNOSIS — Z23 Encounter for immunization: Secondary | ICD-10-CM

## 2023-08-18 DIAGNOSIS — R635 Abnormal weight gain: Secondary | ICD-10-CM

## 2023-08-18 DIAGNOSIS — Z131 Encounter for screening for diabetes mellitus: Secondary | ICD-10-CM | POA: Diagnosis not present

## 2023-08-18 DIAGNOSIS — Z0000 Encounter for general adult medical examination without abnormal findings: Secondary | ICD-10-CM | POA: Diagnosis not present

## 2023-08-18 LAB — POCT URINALYSIS DIP (CLINITEK)
Bilirubin, UA: NEGATIVE
Blood, UA: NEGATIVE
Glucose, UA: NEGATIVE mg/dL
Ketones, POC UA: NEGATIVE mg/dL
Leukocytes, UA: NEGATIVE
Nitrite, UA: NEGATIVE
POC PROTEIN,UA: NEGATIVE
Spec Grav, UA: 1.025 (ref 1.010–1.025)
Urobilinogen, UA: 0.2 U/dL
pH, UA: 7 (ref 5.0–8.0)

## 2023-08-18 MED ORDER — MIRABEGRON ER 50 MG PO TB24
50.0000 mg | ORAL_TABLET | Freq: Every day | ORAL | 1 refills | Status: DC
Start: 2023-08-18 — End: 2024-02-16

## 2023-08-18 NOTE — Patient Instructions (Signed)
Ask V[place]lar ask about.

## 2023-08-19 ENCOUNTER — Encounter: Payer: Self-pay | Admitting: Physician Assistant

## 2023-08-19 DIAGNOSIS — R7303 Prediabetes: Secondary | ICD-10-CM

## 2023-08-19 DIAGNOSIS — E782 Mixed hyperlipidemia: Secondary | ICD-10-CM

## 2023-08-19 DIAGNOSIS — E6609 Other obesity due to excess calories: Secondary | ICD-10-CM

## 2023-08-19 LAB — CMP14+EGFR
ALT: 53 IU/L — ABNORMAL HIGH (ref 0–32)
AST: 31 IU/L (ref 0–40)
Albumin: 4.1 g/dL (ref 3.8–4.9)
Alkaline Phosphatase: 107 IU/L (ref 44–121)
BUN/Creatinine Ratio: 21 (ref 9–23)
BUN: 16 mg/dL (ref 6–24)
Bilirubin Total: <0.2 mg/dL (ref 0.0–1.2)
CO2: 24 mmol/L (ref 20–29)
Calcium: 9.2 mg/dL (ref 8.7–10.2)
Chloride: 104 mmol/L (ref 96–106)
Creatinine, Ser: 0.76 mg/dL (ref 0.57–1.00)
Globulin, Total: 3 g/dL (ref 1.5–4.5)
Glucose: 93 mg/dL (ref 70–99)
Potassium: 4.4 mmol/L (ref 3.5–5.2)
Sodium: 143 mmol/L (ref 134–144)
Total Protein: 7.1 g/dL (ref 6.0–8.5)
eGFR: 91 mL/min/{1.73_m2} (ref >59)

## 2023-08-19 LAB — CBC WITH DIFFERENTIAL/PLATELET
Basophils Absolute: 0.1 10*3/uL (ref 0.0–0.2)
Basos: 1 %
EOS (ABSOLUTE): 0.4 10*3/uL (ref 0.0–0.4)
Eos: 5 %
Hematocrit: 41.2 % (ref 34.0–46.6)
Hemoglobin: 13.1 g/dL (ref 11.1–15.9)
Immature Grans (Abs): 0 10*3/uL (ref 0.0–0.1)
Immature Granulocytes: 0 %
Lymphocytes Absolute: 2.1 10*3/uL (ref 0.7–3.1)
Lymphs: 30 %
MCH: 29.5 pg (ref 26.6–33.0)
MCHC: 31.8 g/dL (ref 31.5–35.7)
MCV: 93 fL (ref 79–97)
Monocytes Absolute: 0.4 10*3/uL (ref 0.1–0.9)
Monocytes: 6 %
Neutrophils Absolute: 4 10*3/uL (ref 1.4–7.0)
Neutrophils: 58 %
Platelets: 248 10*3/uL (ref 150–450)
RBC: 4.44 x10E6/uL (ref 3.77–5.28)
RDW: 12.9 % (ref 11.7–15.4)
WBC: 6.9 10*3/uL (ref 3.4–10.8)

## 2023-08-19 LAB — LIPID PANEL
Chol/HDL Ratio: 2.9 ratio (ref 0.0–4.4)
Cholesterol, Total: 197 mg/dL (ref 100–199)
HDL: 67 mg/dL (ref >39)
LDL Chol Calc (NIH): 106 mg/dL — ABNORMAL HIGH (ref 0–99)
Triglycerides: 139 mg/dL (ref 0–149)
VLDL Cholesterol Cal: 24 mg/dL (ref 5–40)

## 2023-08-19 LAB — HEMOGLOBIN A1C
Est. average glucose Bld gHb Est-mCnc: 117 mg/dL
Hgb A1c MFr Bld: 5.7 % — ABNORMAL HIGH (ref 4.8–5.6)

## 2023-08-19 LAB — TSH+FREE T4
Free T4: 0.98 ng/dL (ref 0.82–1.77)
TSH: 0.887 u[IU]/mL (ref 0.450–4.500)

## 2023-08-19 LAB — CORTISOL: Cortisol: 13 ug/dL (ref 6.2–19.4)

## 2023-08-19 NOTE — Progress Notes (Signed)
Debbie Hancock,   Normal cortisol and thyroid.  Cholesterol is much better.  10 year risk under 7.5 percent.   Marland Kitchen.The 10-year ASCVD risk score (Arnett DK, et al., 2019) is: 4.5%   Values used to calculate the score:     Age: 57 years     Sex: Female     Is Non-Hispanic African American: No     Diabetic: No     Tobacco smoker: Yes     Systolic Blood Pressure: 121 mmHg     Is BP treated: No     HDL Cholesterol: 67 mg/dL     Total Cholesterol: 197 mg/dL  alC in pre-diabetes range I would like to see if we could get injectable once a week covered due to pre-diabetes. Are you ok with this? It can help lower sugars, prevent diabetes, and help with weight loss.

## 2023-08-20 MED ORDER — TIRZEPATIDE 2.5 MG/0.5ML ~~LOC~~ SOAJ
2.5000 mg | SUBCUTANEOUS | 0 refills | Status: DC
Start: 2023-08-20 — End: 2023-09-27

## 2023-08-23 LAB — CYTOLOGY - PAP
Adequacy: ABSENT
Chlamydia: NEGATIVE
Comment: NEGATIVE
Comment: NEGATIVE
Comment: NEGATIVE
Comment: NORMAL
Diagnosis: NEGATIVE
High risk HPV: NEGATIVE
Neisseria Gonorrhea: NEGATIVE
Trichomonas: NEGATIVE

## 2023-08-23 NOTE — Progress Notes (Signed)
No abnormal cells or HPV. You do have vaginal atrophy as suspected with age.

## 2023-08-31 ENCOUNTER — Encounter: Payer: Self-pay | Admitting: Physician Assistant
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
## 2023-09-01 ENCOUNTER — Encounter: Payer: Self-pay | Admitting: Physician Assistant

## 2023-09-01 ENCOUNTER — Ambulatory Visit (INDEPENDENT_AMBULATORY_CARE_PROVIDER_SITE_OTHER): Payer: Medicare HMO | Admitting: Physician Assistant

## 2023-09-01 VITALS — BP 108/64 | HR 88 | Resp 20 | Ht 63.0 in | Wt 186.0 lb

## 2023-09-01 DIAGNOSIS — R7303 Prediabetes: Secondary | ICD-10-CM

## 2023-09-01 DIAGNOSIS — S01312A Laceration without foreign body of left ear, initial encounter: Secondary | ICD-10-CM

## 2023-09-01 DIAGNOSIS — Z23 Encounter for immunization: Secondary | ICD-10-CM | POA: Diagnosis not present

## 2023-09-01 DIAGNOSIS — E782 Mixed hyperlipidemia: Secondary | ICD-10-CM

## 2023-09-01 DIAGNOSIS — E6609 Other obesity due to excess calories: Secondary | ICD-10-CM

## 2023-09-01 DIAGNOSIS — Z6832 Body mass index (BMI) 32.0-32.9, adult: Secondary | ICD-10-CM

## 2023-09-01 MED ORDER — METFORMIN HCL 500 MG PO TABS
500.0000 mg | ORAL_TABLET | Freq: Two times a day (BID) | ORAL | 0 refills | Status: DC
Start: 2023-09-01 — End: 2023-10-12

## 2023-09-01 NOTE — Patient Instructions (Addendum)
Protein 60g-80g Calories 1200  Exercise at least 30 minutes of cardio a 5-6 days a week 3-4 times a week weight resistance training 30 times with 3 reps
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
## 2023-09-02 ENCOUNTER — Other Ambulatory Visit: Payer: Self-pay | Admitting: Physician Assistant

## 2023-09-02 DIAGNOSIS — N3281 Overactive bladder: Secondary | ICD-10-CM

## 2023-09-03 ENCOUNTER — Encounter: Payer: Self-pay | Admitting: Physician Assistant

## 2023-09-03 DIAGNOSIS — S01312A Laceration without foreign body of left ear, initial encounter: Secondary | ICD-10-CM | POA: Insufficient documentation

## 2023-09-21 DIAGNOSIS — F3181 Bipolar II disorder: Secondary | ICD-10-CM | POA: Diagnosis not present

## 2023-09-27 ENCOUNTER — Telehealth (INDEPENDENT_AMBULATORY_CARE_PROVIDER_SITE_OTHER): Payer: Medicare HMO | Admitting: Physician Assistant

## 2023-09-27 ENCOUNTER — Encounter: Payer: Self-pay | Admitting: Physician Assistant

## 2023-09-27 VITALS — Wt 169.0 lb

## 2023-09-27 DIAGNOSIS — R7303 Prediabetes: Secondary | ICD-10-CM | POA: Diagnosis not present

## 2023-09-27 DIAGNOSIS — E663 Overweight: Secondary | ICD-10-CM | POA: Diagnosis not present

## 2023-09-27 DIAGNOSIS — E782 Mixed hyperlipidemia: Secondary | ICD-10-CM | POA: Diagnosis not present

## 2023-09-27 MED ORDER — TIRZEPATIDE 5 MG/0.5ML ~~LOC~~ SOAJ: 5.0000 mg | SUBCUTANEOUS | 1 refills | Status: DC
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
## 2023-10-01 ENCOUNTER — Telehealth: Payer: Self-pay

## 2023-10-01 NOTE — Telephone Encounter (Signed)
Initiated Prior authorization ZOX:[id number] 2.5MG /0.5ML auto-injectors Via: Covermymeds Case/Key:[id number] Status: n/a as of 10/01/23 Reason:Available without authorization.   Notified Pt via: Mychart

## 2023-10-12 ENCOUNTER — Telehealth (INDEPENDENT_AMBULATORY_CARE_PROVIDER_SITE_OTHER): Payer: Medicare HMO | Admitting: Physician Assistant

## 2023-10-12 VITALS — Ht 63.0 in | Wt 168.0 lb

## 2023-10-12 DIAGNOSIS — R7303 Prediabetes: Secondary | ICD-10-CM | POA: Diagnosis not present

## 2023-10-12 DIAGNOSIS — E663 Overweight: Secondary | ICD-10-CM

## 2023-10-12 DIAGNOSIS — E782 Mixed hyperlipidemia: Secondary | ICD-10-CM | POA: Diagnosis not present

## 2023-10-12 DIAGNOSIS — Z8639 Personal history of other endocrine, nutritional and metabolic disease: Secondary | ICD-10-CM | POA: Diagnosis not present

## 2023-10-12 DIAGNOSIS — B351 Tinea unguium: Secondary | ICD-10-CM

## 2023-10-12 MED ORDER — CICLOPIROX 8 % EX SOLN: Freq: Every day | CUTANEOUS | 1 refills | Status: DC

## 2023-10-12 MED ORDER — TIRZEPATIDE 7.5 MG/0.5ML ~~LOC~~ SOAJ
7.5000 mg | SUBCUTANEOUS | 2 refills | Status: DC
Start: 2023-10-12 — End: 2023-12-14

## 2023-10-12 NOTE — Progress Notes (Unsigned)
Pt  reports she doing well on Mounjaro and has some constipation.
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
## 2023-10-13 ENCOUNTER — Encounter: Payer: Self-pay | Admitting: Physician Assistant

## 2023-11-05 ENCOUNTER — Encounter: Payer: Self-pay | Admitting: Physician Assistant

## 2023-11-05 ENCOUNTER — Telehealth (INDEPENDENT_AMBULATORY_CARE_PROVIDER_SITE_OTHER): Payer: Medicare HMO | Admitting: Physician Assistant

## 2023-11-05 VITALS — Ht 63.0 in | Wt 162.0 lb

## 2023-11-05 DIAGNOSIS — E663 Overweight: Secondary | ICD-10-CM

## 2023-11-05 DIAGNOSIS — F3181 Bipolar II disorder: Secondary | ICD-10-CM

## 2023-11-05 DIAGNOSIS — F332 Major depressive disorder, recurrent severe without psychotic features: Secondary | ICD-10-CM

## 2023-11-05 DIAGNOSIS — F419 Anxiety disorder, unspecified: Secondary | ICD-10-CM

## 2023-11-05 DIAGNOSIS — Z8639 Personal history of other endocrine, nutritional and metabolic disease: Secondary | ICD-10-CM | POA: Diagnosis not present

## 2023-11-05 MED ORDER — SERTRALINE HCL 100 MG PO TABS: 100.0000 mg | ORAL_TABLET | Freq: Two times a day (BID) | ORAL | 0 refills | Status: DC

## 2023-11-05 NOTE — Progress Notes (Signed)
Pt reports she doing well on mounjaro and would like to titrate up to the next dose , pt states she has lost 20lb and is tolerating well
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
## 2023-11-18 ENCOUNTER — Ambulatory Visit: Payer: Self-pay | Admitting: Physician Assistant

## 2023-11-18 NOTE — Telephone Encounter (Addendum)
Copied from CRM [id number]. Topic: Clinical - Red Word Triage >> Nov 18, 2023  9:14 AM Cassiday T wrote: Red Word that prompted transfer to Nurse Triage: patient is really bad pain she said her pain level is a ten she has a really bad uti she has a appt for tomorrow but needs to be seen sooner she says.  UPDATE: Spoke with pt again, pt clarified that been having only droplets of urine come out every time urinating for 2-3 days. Pt confirms that her 10/10 pain is constant, stating that it is "so uncomfortable, don't want to walk a lot because flares it up more." Pt also mentioned hx of ulcers inside bladder, has a "bad bladder," interstitial cystitis. Pt denies fever and feelings of fullness, denies palpable bladder. Advised pt go to ED, pt agreed and will head there shortly. Pt wanting to keep her appts for tomorrow.  Chief Complaint: Pain 10/10 to pelvic area Symptoms: Urinary frequency and urgency, pain to pelvic area "front and low" with "bad pressure" while urinating, "barely any [urine] comes out" even with "sensation all the time" Frequency: Constant Pertinent Negatives: Patient denies blood in urine, flank pain Disposition: [] ED /[] Urgent Care (no appt availability in office) / [x] Appointment(In office/virtual)/ []  [facility]/ [] Home Care/ [] Refused Recommended Disposition /[] [facility]/ []  Follow-up with PCP Additional Notes: Pt reporting that she believes she has a UTI, requesting antibiotics. Pt reporting 10/10 pain "front and low" to pelvic area, "can't even walk it's so bad." Pt also reporting that she feels the "sensation all the time" to urinate but "barely any comes out," and has "bad pressure when" she urinates. Pt denies blood in urine, denies flank pain. Pt requesting appt with PCP, no availability today. Advised UC or since 10/10 pain ED. Pt refuses UC or ED today. Pt requesting appt tomorrow, appt scheduled with PCP. Advised ED if pain severe, pt refused, pt  reporting that she can wait until tomorrow. Added appt to waitlist. Pt confirms wanting to keep other video appt tomorrow as well. Pt verbalized understanding to go to ED if worsening symptoms or severe pain. Pt requesting antibiotics be sent. Informed pt that nurse would send message to office: Can pt receive clean-catch supplies and drop off urine culture sample for potential antibiotics today? LVM to pt to follow up.  Reason for Disposition  Age > 50 years  Answer Assessment - Initial Assessment Questions 1. SYMPTOM: "What's the main symptom you're concerned about?" (e.g., frequency, incontinence)     Feeling 10/10 pain in pelvic area, "can't even walk it's so bad," "constantly going to the bathroom, sensation all the time, barely any comes out, bad pressure when pee" 3. PAIN: "Is there any pain?" If Yes, ask: "How bad is it?" (Scale: 1-10; mild, moderate, severe)     10/10, "can't even walk" 4. CAUSE: "What do you think is causing the symptoms?"     UTI, has hx of UTI 5. OTHER SYMPTOMS: "Do you have any other symptoms?" (e.g., blood in urine, fever, flank pain, pain with urination)     Pain with urination. Denies blood in urine, denies pain elsewhere.  Answer Assessment - Initial Assessment Questions 1. SEVERITY: "How bad is the pain?"  (e.g., Scale 1-10; mild, moderate, or severe)   - MILD (1-3): complains slightly about urination hurting   - MODERATE (4-7): interferes with normal activities     - SEVERE (8-10): excruciating, unwilling or unable to urinate because of the pain  10/10 pain, "can't even walk it's so bad" 2. FREQUENCY: "How many times have you had painful urination today?"      "Constantly going to the bathroom" 3. PATTERN: "Is pain present every time you urinate or just sometimes?"      Each time 6. PAST UTI: "Have you had a urine infection before?" If Yes, ask: "When was the last time?" and "What happened that time?"      Yes, last year, relieved with antibiotics 7.  CAUSE: "What do you think is causing the painful urination?"  (e.g., UTI, scratch, Herpes sore)     UTI based on past hx with UTI 8. OTHER SYMPTOMS: "Do you have any other symptoms?" (e.g., blood in urine, flank pain, genital sores, urgency, vaginal discharge)     Urinary frequency and urgency with "sensation all the time"  Protocols used: Urinary Symptoms-A-AH, Urination Pain - Female-A-AH

## 2023-11-18 NOTE — Telephone Encounter (Signed)
Reason for Disposition  . [1] Unable to urinate (or only a few drops) > 4 hours AND [2] bladder feels very full (e.g., palpable bladder or strong urge to urinate)    Protocols used: Urinary Symptoms-A-AH

## 2023-11-19 ENCOUNTER — Encounter: Payer: Self-pay | Admitting: Physician Assistant

## 2023-11-19 ENCOUNTER — Ambulatory Visit: Payer: Medicare HMO | Admitting: Physician Assistant

## 2023-11-19 ENCOUNTER — Ambulatory Visit (INDEPENDENT_AMBULATORY_CARE_PROVIDER_SITE_OTHER): Payer: Medicare HMO | Admitting: Physician Assistant

## 2023-11-19 VITALS — BP 103/71 | HR 90 | Resp 12 | Ht 63.0 in | Wt 155.0 lb

## 2023-11-19 DIAGNOSIS — R300 Dysuria: Secondary | ICD-10-CM | POA: Diagnosis not present

## 2023-11-19 LAB — POCT URINALYSIS DIP (CLINITEK)
Blood, UA: NEGATIVE
Glucose, UA: NEGATIVE mg/dL
Leukocytes, UA: NEGATIVE
Nitrite, UA: NEGATIVE
Spec Grav, UA: >=1.03 — AB (ref 1.010–1.025)
Urobilinogen, UA: 0.2 U/dL
pH, UA: 6 (ref 5.0–8.0)

## 2023-11-19 MED ORDER — PHENAZOPYRIDINE HCL 200 MG PO TABS: 200.0000 mg | ORAL_TABLET | Freq: Three times a day (TID) | ORAL | 0 refills | Status: AC
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
## 2023-11-19 NOTE — Patient Instructions (Signed)
Start pyridium Will culture urine and let you know results.   Interstitial Cystitis  Interstitial cystitis (IC) is inflammation of the bladder. It's also called painful bladder syndrome. It can cause pain near your bladder. It can also make you have to pee urgently and often. IC may flare up and then go away for a while. In some cases, it may become a long-term (chronic) problem. What are the causes? The cause of IC isn't known. What increases the risk? You may be more likely to get IC if: You're female. You have certain other conditions. These include: Fibromyalgia. Irritable bowel syndrome (IBS). Endometriosis. Chronic fatigue syndrome. You may have worse symptoms if: You're under a lot of stress. You smoke. You have certain foods or drinks. What are the signs or symptoms? Your symptoms may change over time. They may include: Discomfort or pain near your bladder. The pain may range from mild to very bad. You may have more or less pain as your bladder fills with pee and empties. Pain in your pelvic area. This is the area between your hip bones. Needing to pee often or all the time. Pain when you pee. Pain during sex. Blood in your pee. Tiredness. If you're female, your symptoms may get worse when you have your menstrual period. How is this diagnosed? IC is diagnosed based on your symptoms, your medical history, and an exam. Your health care provider may need to rule out other conditions. They may do tests, such as: Pee tests. A cystoscopy. This test looks at the inside of your bladder. Biopsy. This is when a small piece of tissue is removed from your bladder for testing. How is this treated? There's no cure for IC. But treatment can help you manage your symptoms. Work with your provider to find the best treatments for you. These may include: Medicines to help with pain or to reduce how often you feel the need to pee. These may be given by mouth or put in your bladder using a  soft tube called a catheter. Diet changes. Taking steps to manage stress. Physical therapy. This may include: Exercises. These can help you relax your pelvic floor muscles. Massage. This may be done to relax tight muscles. Bladder training. This is when you learn ways to control when you pee. Neuromodulation therapy. This uses a device that's put on your back. It blocks the nerves that cause you to feel pain near your bladder. A procedure to stretch your bladder. This may be done by filling your bladder with air or fluid. Surgery. This is rare. It's only done if other treatments don't help. Follow these instructions at home: Eating and drinking Make changes to your diet as told by your provider. You may need to avoid: Spicy foods. Foods with acid in them, like tomatoes or citrus. Foods with a lot of potassium in them. Avoid drinking alcohol and caffeine. These drinks can make you have to pee more. Lifestyle Learn and practice ways to relax. These may include deep breathing and muscle relaxation. Get care for your mental well-being. You may need to: Do cognitive behavioral therapy (CBT). This therapy can change the way you think or act in response to things. It may help you feel better. See a therapist if you're depressed. Work with your provider on other ways to manage pain. Acupuncture may help. Do not use any products that contain nicotine or tobacco. These products include cigarettes, chewing tobacco, and vaping devices, such as e-cigarettes. If you need help quitting,  ask your provider. Bladder training  Do bladder training as told. You may need to: Pee at set, regular times. Train yourself to delay peeing. Keep a bladder diary. Write down: The times you pee. Any symptoms you have. The diary can help you find out what makes your symptoms worse. Use the diary to schedule times to pee. If you're away from home, plan to be near a bathroom at those times. Make sure you pee: Just  before you leave the house. Just before you go to bed. General instructions Take over-the-counter and prescription medicines only as told by your provider. Try putting a warm or cool cloth called a compress over your bladder. This can help with pain. Avoid wearing tight clothes. Do exercises as told by your provider. Where to find more information [employer]: urologyhealth.org Interstitial Cystitis Association (ICA): [employer] Contact a health care provider if: Your symptoms don't get better with treatment. Your pain or discomfort gets worse. You have to pee more often. You have little to no control over when you pee. You have a fever or chills. This information is not intended to replace advice given to you by your health care provider. Make sure you discuss any questions you have with your health care provider. Document Revised: 03/06/2023 Document Reviewed: 03/06/2023 Elsevier Patient Education  2024 [employer].

## 2023-11-19 NOTE — Progress Notes (Signed)
poct

## 2023-11-23 ENCOUNTER — Other Ambulatory Visit: Payer: Self-pay | Admitting: Physician Assistant

## 2023-11-23 LAB — URINE CULTURE

## 2023-11-23 MED ORDER — NITROFURANTOIN MONOHYD MACRO 100 MG PO CAPS
100.0000 mg | ORAL_CAPSULE | Freq: Two times a day (BID) | ORAL | 0 refills | Status: DC
Start: 2023-11-23 — End: 2023-12-13

## 2023-11-23 NOTE — Telephone Encounter (Signed)
Patient seen in office 11/19/23=kph

## 2023-11-23 NOTE — Progress Notes (Signed)
Urine culture did grow out bacteria. Will send antibiotic to pharmacy to start. Let me know if symptoms not improving on antibiotic.

## 2023-12-13 ENCOUNTER — Telehealth (INDEPENDENT_AMBULATORY_CARE_PROVIDER_SITE_OTHER): Payer: Medicare HMO | Admitting: Physician Assistant

## 2023-12-13 ENCOUNTER — Other Ambulatory Visit: Payer: Self-pay | Admitting: Medical Genetics

## 2023-12-13 VITALS — Ht 63.0 in | Wt 148.0 lb

## 2023-12-13 DIAGNOSIS — E782 Mixed hyperlipidemia: Secondary | ICD-10-CM

## 2023-12-13 DIAGNOSIS — B351 Tinea unguium: Secondary | ICD-10-CM

## 2023-12-13 DIAGNOSIS — Z8639 Personal history of other endocrine, nutritional and metabolic disease: Secondary | ICD-10-CM | POA: Diagnosis not present

## 2023-12-13 DIAGNOSIS — F3181 Bipolar II disorder: Secondary | ICD-10-CM | POA: Diagnosis not present

## 2023-12-13 DIAGNOSIS — R7303 Prediabetes: Secondary | ICD-10-CM

## 2023-12-13 DIAGNOSIS — E663 Overweight: Secondary | ICD-10-CM | POA: Diagnosis not present

## 2023-12-13 MED ORDER — TERBINAFINE HCL 250 MG PO TABS: 250.0000 mg | ORAL_TABLET | Freq: Every day | ORAL | 0 refills | Status: AC

## 2023-12-14 MED ORDER — TIRZEPATIDE 7.5 MG/0.5ML ~~LOC~~ SOAJ: 7.5000 mg | SUBCUTANEOUS | 0 refills | Status: DC

## 2023-12-16 ENCOUNTER — Encounter: Payer: Self-pay | Admitting: Physician Assistant

## 2023-12-29 ENCOUNTER — Telehealth: Payer: Self-pay

## 2023-12-29 NOTE — Telephone Encounter (Signed)
 Copied from CRM [id number]. Topic: Clinical - Prescription Issue >> Dec 29, 2023  1:09 PM Debbie Hancock B wrote: Reason for CRM:  tirzepatide  (MOUNJARO ) 7.5 MG/0.5ML Pen   PRIOR AUTH NEEDED AT [employer]

## 2024-01-02 ENCOUNTER — Other Ambulatory Visit: Payer: Self-pay | Admitting: Physician Assistant

## 2024-01-02 DIAGNOSIS — R7303 Prediabetes: Secondary | ICD-10-CM

## 2024-01-02 DIAGNOSIS — Z8639 Personal history of other endocrine, nutritional and metabolic disease: Secondary | ICD-10-CM

## 2024-01-02 DIAGNOSIS — E782 Mixed hyperlipidemia: Secondary | ICD-10-CM

## 2024-01-02 DIAGNOSIS — E663 Overweight: Secondary | ICD-10-CM

## 2024-01-11 DIAGNOSIS — R300 Dysuria: Secondary | ICD-10-CM | POA: Diagnosis not present

## 2024-01-12 ENCOUNTER — Ambulatory Visit (INDEPENDENT_AMBULATORY_CARE_PROVIDER_SITE_OTHER): Payer: Medicare HMO | Admitting: Physician Assistant

## 2024-01-12 ENCOUNTER — Encounter: Payer: Self-pay | Admitting: Physician Assistant

## 2024-01-12 VITALS — BP 118/73 | HR 101 | Temp 97.8°F | Ht 63.0 in | Wt 143.0 lb

## 2024-01-12 DIAGNOSIS — R300 Dysuria: Secondary | ICD-10-CM | POA: Insufficient documentation

## 2024-01-12 DIAGNOSIS — R102 Pelvic and perineal pain: Secondary | ICD-10-CM

## 2024-01-12 DIAGNOSIS — R110 Nausea: Secondary | ICD-10-CM

## 2024-01-12 LAB — POCT URINALYSIS DIP (CLINITEK)
Blood, UA: NEGATIVE
Glucose, UA: NEGATIVE mg/dL
Leukocytes, UA: NEGATIVE
Nitrite, UA: NEGATIVE
POC PROTEIN,UA: 100 — AB
Spec Grav, UA: 1.025 (ref 1.010–1.025)
Urobilinogen, UA: 0.2 U/dL
pH, UA: 6.5 (ref 5.0–8.0)

## 2024-01-12 MED ORDER — CEFTRIAXONE SODIUM 1 G IJ SOLR
1.0000 g | Freq: Once | INTRAMUSCULAR | Status: AC
  Administered 2024-01-12: 1 g via INTRAMUSCULAR

## 2024-01-12 MED ORDER — PHENAZOPYRIDINE HCL 200 MG PO TABS: 200.0000 mg | ORAL_TABLET | Freq: Three times a day (TID) | ORAL | 0 refills | Status: AC

## 2024-01-12 NOTE — Patient Instructions (Signed)
Start pyridium and wait for culture.  Get labs today.

## 2024-01-12 NOTE — Addendum Note (Signed)
Addended by: Elizabeth Palau on: 01/12/2024 12:54 PM   Modules accepted: Orders
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
## 2024-01-13 ENCOUNTER — Encounter: Payer: Self-pay | Admitting: Physician Assistant

## 2024-01-13 LAB — CBC WITH DIFFERENTIAL/PLATELET
Basophils Absolute: 0.1 10*3/uL (ref 0.0–0.2)
Basos: 1 %
EOS (ABSOLUTE): 0.2 10*3/uL (ref 0.0–0.4)
Eos: 3 %
Hematocrit: 46.2 % (ref 34.0–46.6)
Hemoglobin: 15.2 g/dL (ref 11.1–15.9)
Immature Grans (Abs): 0 10*3/uL (ref 0.0–0.1)
Immature Granulocytes: 0 %
Lymphocytes Absolute: 1.6 10*3/uL (ref 0.7–3.1)
Lymphs: 27 %
MCH: 30.8 pg (ref 26.6–33.0)
MCHC: 32.9 g/dL (ref 31.5–35.7)
MCV: 94 fL (ref 79–97)
Monocytes Absolute: 0.3 10*3/uL (ref 0.1–0.9)
Monocytes: 6 %
Neutrophils Absolute: 3.8 10*3/uL (ref 1.4–7.0)
Neutrophils: 63 %
Platelets: 215 10*3/uL (ref 150–450)
RBC: 4.94 x10E6/uL (ref 3.77–5.28)
RDW: 14 % (ref 11.7–15.4)
WBC: 6 10*3/uL (ref 3.4–10.8)

## 2024-01-13 LAB — CMP14+EGFR
ALT: 18 [IU]/L (ref 0–32)
AST: 15 [IU]/L (ref 0–40)
Albumin: 4.1 g/dL (ref 3.8–4.9)
Alkaline Phosphatase: 83 [IU]/L (ref 44–121)
BUN/Creatinine Ratio: 14 (ref 9–23)
BUN: 12 mg/dL (ref 6–24)
Bilirubin Total: 0.3 mg/dL (ref 0.0–1.2)
CO2: 21 mmol/L (ref 20–29)
Calcium: 9.5 mg/dL (ref 8.7–10.2)
Chloride: 103 mmol/L (ref 96–106)
Creatinine, Ser: 0.85 mg/dL (ref 0.57–1.00)
Globulin, Total: 2.6 g/dL (ref 1.5–4.5)
Glucose: 87 mg/dL (ref 70–99)
Potassium: 3.9 mmol/L (ref 3.5–5.2)
Sodium: 142 mmol/L (ref 134–144)
Total Protein: 6.7 g/dL (ref 6.0–8.5)
eGFR: 79 mL/min/{1.73_m2} (ref >59)

## 2024-01-13 LAB — LIPASE: Lipase: 25 U/L (ref 14–72)

## 2024-01-14 ENCOUNTER — Encounter: Payer: Self-pay | Admitting: Physician Assistant

## 2024-01-14 LAB — URINE CULTURE: Organism ID, Bacteria: NO GROWTH

## 2024-01-14 NOTE — Progress Notes (Signed)
Debbie Hancock,   No signs of pancreatitis. GREAT news.  Kidney, liver, glucose look GREAT.  Normal WBC and hemoglobin.  Labs look great.

## 2024-01-14 NOTE — Progress Notes (Signed)
No bacterial growth on culture. This was IC inflammation flare.

## 2024-01-25 ENCOUNTER — Other Ambulatory Visit: Payer: Self-pay | Admitting: Physician Assistant

## 2024-01-25 DIAGNOSIS — E781 Pure hyperglyceridemia: Secondary | ICD-10-CM

## 2024-02-16 ENCOUNTER — Encounter: Payer: Self-pay | Admitting: Physician Assistant

## 2024-02-16 ENCOUNTER — Telehealth (INDEPENDENT_AMBULATORY_CARE_PROVIDER_SITE_OTHER): Admitting: Physician Assistant

## 2024-02-16 ENCOUNTER — Other Ambulatory Visit: Payer: Self-pay | Admitting: Physician Assistant

## 2024-02-16 DIAGNOSIS — F419 Anxiety disorder, unspecified: Secondary | ICD-10-CM | POA: Diagnosis not present

## 2024-02-16 DIAGNOSIS — S01312A Laceration without foreign body of left ear, initial encounter: Secondary | ICD-10-CM

## 2024-02-16 DIAGNOSIS — S01312D Laceration without foreign body of left ear, subsequent encounter: Secondary | ICD-10-CM | POA: Diagnosis not present

## 2024-02-16 DIAGNOSIS — Z1231 Encounter for screening mammogram for malignant neoplasm of breast: Secondary | ICD-10-CM

## 2024-02-16 DIAGNOSIS — F332 Major depressive disorder, recurrent severe without psychotic features: Secondary | ICD-10-CM

## 2024-02-16 DIAGNOSIS — F3181 Bipolar II disorder: Secondary | ICD-10-CM

## 2024-02-16 DIAGNOSIS — B351 Tinea unguium: Secondary | ICD-10-CM | POA: Diagnosis not present

## 2024-02-16 DIAGNOSIS — R062 Wheezing: Secondary | ICD-10-CM | POA: Diagnosis not present

## 2024-02-16 DIAGNOSIS — N3281 Overactive bladder: Secondary | ICD-10-CM | POA: Diagnosis not present

## 2024-02-16 DIAGNOSIS — S01312S Laceration without foreign body of left ear, sequela: Secondary | ICD-10-CM

## 2024-02-16 MED ORDER — MIRABEGRON ER 50 MG PO TB24: 50.0000 mg | ORAL_TABLET | Freq: Every day | ORAL | 1 refills | Status: DC

## 2024-02-16 MED ORDER — NORTRIPTYLINE HCL 50 MG PO CAPS: 50.0000 mg | ORAL_CAPSULE | Freq: Two times a day (BID) | ORAL | 1 refills | Status: DC

## 2024-02-16 MED ORDER — SERTRALINE HCL 100 MG PO TABS: 100.0000 mg | ORAL_TABLET | Freq: Two times a day (BID) | ORAL | 1 refills | Status: DC

## 2024-02-16 MED ORDER — MONTELUKAST SODIUM 10 MG PO TABS: 10.0000 mg | ORAL_TABLET | Freq: Every day | ORAL | 3 refills | Status: AC

## 2024-02-16 NOTE — Progress Notes (Signed)
..  Virtual Visit via Video Note  I connected with Debbie Hancock on 02/20/24 at  8:10 AM EST by a video enabled telemedicine application and verified that I am speaking with the correct person using two identifiers.  Location: Patient: home Provider: clinic  .Marland KitchenParticipating in visit:  Patient: Debbie Hancock Provider: Tandy Gaw PA-C Provider in training: Ilean China PA-S   I discussed the limitations of evaluation and management by telemedicine and the availability of in person appointments. The patient expressed understanding and agreed to proceed.  History of Present Illness: Pt is a 58 yo female who calls into the clinic with some concerns.   Her bilateral great toenail fungus is not improving on the right and only minimally on the left. She has failed penlac and lamisil. She request podiatry referral.   She would like her ear lobe to be repaired and wonders where to go. This happened months ago where her ear ring fell through the hole but ready to have repaired. No pain and laceration is fully healed.   She needs refills of some medications. No concerns. Mood is good. Allergies doing well. No concerns with OAB at this time.   Observations/Objective: No acute distress  Normal breathing Left ear lobe laceration    Assessment and Plan: Marland KitchenMarland KitchenDiagnoses and all orders for this visit:  Toenail fungus -     Ambulatory referral to Podiatry  Bipolar 2 disorder (HCC) -     sertraline (ZOLOFT) 100 MG tablet; Take 1 tablet (100 mg total) by mouth in the morning and at bedtime.  Severe episode of recurrent major depressive disorder, without psychotic features (HCC) -     sertraline (ZOLOFT) 100 MG tablet; Take 1 tablet (100 mg total) by mouth in the morning and at bedtime.  Anxiety -     sertraline (ZOLOFT) 100 MG tablet; Take 1 tablet (100 mg total) by mouth in the morning and at bedtime.  OAB (overactive bladder) -     mirabegron ER (MYRBETRIQ) 50 MG TB24 tablet; Take 1 tablet (50 mg  total) by mouth daily. -     nortriptyline (PAMELOR) 50 MG capsule; Take 1 capsule (50 mg total) by mouth 2 (two) times daily.  Wheezing -     montelukast (SINGULAIR) 10 MG tablet; Take 1 tablet (10 mg total) by mouth at bedtime.  Ear lobe laceration, left, sequela -     Ambulatory referral to Plastic Surgery   Pt failed penlac and lamisil. Referral to podiatry.   Referral to plastics for ear lobe repair.   Refills given as needed.    Follow Up Instructions:    I discussed the assessment and treatment plan with the patient. The patient was provided an opportunity to ask questions and all were answered. The patient agreed with the plan and demonstrated an understanding of the instructions.   The patient was advised to call back or seek an in-person evaluation if the symptoms worsen or if the condition fails to improve as anticipated.   Tandy Gaw, PA-C

## 2024-02-19 ENCOUNTER — Other Ambulatory Visit: Payer: Self-pay | Admitting: Physician Assistant

## 2024-02-19 DIAGNOSIS — N3281 Overactive bladder: Secondary | ICD-10-CM

## 2024-02-22 ENCOUNTER — Other Ambulatory Visit (HOSPITAL_COMMUNITY)
Admission: RE | Admit: 2024-02-22 | Discharge: 2024-02-22 | Disposition: A | Discharge status: Home / Self Care | Payer: Self-pay | Source: Ambulatory Visit | Attending: Medical Genetics | Admitting: Medical Genetics

## 2024-02-24 ENCOUNTER — Encounter: Payer: Self-pay | Admitting: Podiatry

## 2024-02-24 ENCOUNTER — Ambulatory Visit: Payer: Self-pay | Admitting: Podiatry

## 2024-02-24 DIAGNOSIS — B351 Tinea unguium: Secondary | ICD-10-CM

## 2024-02-24 DIAGNOSIS — L609 Nail disorder, unspecified: Secondary | ICD-10-CM | POA: Diagnosis not present

## 2024-02-24 NOTE — Addendum Note (Signed)
 Addended by: Daryel November on: 02/24/2024 09:47 AM   Modules accepted: Orders
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
## 2024-02-25 ENCOUNTER — Other Ambulatory Visit: Payer: Self-pay | Admitting: Physician Assistant

## 2024-02-25 DIAGNOSIS — G894 Chronic pain syndrome: Secondary | ICD-10-CM

## 2024-02-25 DIAGNOSIS — M797 Fibromyalgia: Secondary | ICD-10-CM

## 2024-02-28 ENCOUNTER — Encounter: Payer: Self-pay | Admitting: Physician Assistant

## 2024-02-28 DIAGNOSIS — F3181 Bipolar II disorder: Secondary | ICD-10-CM | POA: Diagnosis not present

## 2024-02-28 DIAGNOSIS — K2960 Other gastritis without bleeding: Secondary | ICD-10-CM

## 2024-02-29 MED ORDER — OMEPRAZOLE 40 MG PO CPDR: 40.0000 mg | DELAYED_RELEASE_CAPSULE | Freq: Every day | ORAL | 3 refills | Status: AC

## 2024-03-02 ENCOUNTER — Encounter

## 2024-03-02 ENCOUNTER — Ambulatory Visit

## 2024-03-02 DIAGNOSIS — Z1231 Encounter for screening mammogram for malignant neoplasm of breast: Secondary | ICD-10-CM

## 2024-03-06 ENCOUNTER — Encounter: Payer: Self-pay | Admitting: Physician Assistant

## 2024-03-06 ENCOUNTER — Other Ambulatory Visit: Payer: Self-pay | Admitting: Podiatry

## 2024-03-06 NOTE — Progress Notes (Signed)
 Normal mammogram. Follow up in 1 year.

## 2024-03-09 NOTE — Telephone Encounter (Signed)
 Per Keyairea's note on 12/16/23:   According to [facility] available without authorization. The pa request that was submitted was cancelled by pts insurance

## 2024-03-14 ENCOUNTER — Ambulatory Visit (INDEPENDENT_AMBULATORY_CARE_PROVIDER_SITE_OTHER)

## 2024-03-14 VITALS — Ht 63.0 in | Wt 135.0 lb

## 2024-03-14 DIAGNOSIS — Z0000 Encounter for general adult medical examination without abnormal findings: Secondary | ICD-10-CM | POA: Diagnosis not present
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
## 2024-03-14 NOTE — Patient Instructions (Signed)
  Debbie Hancock , Thank you for taking time to come for your Medicare Wellness Visit. I appreciate your ongoing commitment to your health goals. Please review the following plan we discussed and let me know if I can assist you in the future.   These are the goals we discussed:  Goals       Activity and Exercise Increased      She would like to be more active and exercise more.       Patient Stated (pt-stated)      Patient stated that she would like to lower cholesterol.         This is a list of the screening recommended for you and due dates:  Health Maintenance  Topic Date Due   COVID-19 Vaccine (5 - 2024-25 season) 08/15/2023   Flu Shot  07/14/2024   Pap Smear  08/17/2024   Mammogram  03/02/2025   Medicare Annual Wellness Visit  03/14/2025   Colon Cancer Screening  06/04/2025   DTaP/Tdap/Td vaccine (2 - Td or Tdap) 08/08/2028   Pneumococcal Vaccination  Completed   Hepatitis C Screening  Completed   HIV Screening  Completed   Zoster (Shingles) Vaccine  Completed   HPV Vaccine  Aged Out   Screening for Lung Cancer  Discontinued

## 2024-03-20 ENCOUNTER — Telehealth (INDEPENDENT_AMBULATORY_CARE_PROVIDER_SITE_OTHER): Admitting: Physician Assistant

## 2024-03-20 ENCOUNTER — Other Ambulatory Visit: Payer: Self-pay | Admitting: Physician Assistant

## 2024-03-20 ENCOUNTER — Encounter: Payer: Self-pay | Admitting: Physician Assistant

## 2024-03-20 VITALS — Ht 63.0 in | Wt 137.0 lb

## 2024-03-20 DIAGNOSIS — E663 Overweight: Secondary | ICD-10-CM | POA: Diagnosis not present

## 2024-03-20 DIAGNOSIS — Z8639 Personal history of other endocrine, nutritional and metabolic disease: Secondary | ICD-10-CM | POA: Diagnosis not present

## 2024-03-20 DIAGNOSIS — E782 Mixed hyperlipidemia: Secondary | ICD-10-CM

## 2024-03-20 DIAGNOSIS — F3181 Bipolar II disorder: Secondary | ICD-10-CM

## 2024-03-20 DIAGNOSIS — F332 Major depressive disorder, recurrent severe without psychotic features: Secondary | ICD-10-CM

## 2024-03-20 DIAGNOSIS — R7303 Prediabetes: Secondary | ICD-10-CM | POA: Diagnosis not present

## 2024-03-20 DIAGNOSIS — F419 Anxiety disorder, unspecified: Secondary | ICD-10-CM

## 2024-03-20 MED ORDER — MOUNJARO 7.5 MG/0.5ML ~~LOC~~ SOAJ: 7.5000 mg | SUBCUTANEOUS | 0 refills | Status: DC

## 2024-03-20 NOTE — Progress Notes (Signed)
..  Virtual Visit via Video Note  I connected with Debbie Hancock on 03/20/24 at  8:30 AM EDT by a video enabled telemedicine application and verified that I am speaking with the correct person using two identifiers.  Location: Patient: home Provider: clinic  .Debbie KitchenParticipating in visit:  Patient:Debbie Hancock Provider: Tandy Gaw PA-C   I discussed the limitations of evaluation and management by telemedicine and the availability of in person appointments. The patient expressed understanding and agreed to proceed.  History of Present Illness: Pt calls into the clinic needing a referral to [facility] in [place] where she goes for her mental health but needs new referral.   She continues on mounjaro and doing well. No side effects. She is staying stable in weight currently. She is active and trying to keep to a healthy diet.     Observations/Objective: No acute distress Normal mood and appearance  .Debbie Hancock Today's Vitals   03/20/24 0812  Weight: 137 lb (62.1 kg)  Height: 5\' 3"  (1.6 m)   Body mass index is 24.27 kg/m.    Assessment and Plan: Debbie KitchenMarland KitchenChanee was seen today for medical management of chronic issues.  Diagnoses and all orders for this visit:  Bipolar 2 disorder (HCC) -     Ambulatory referral to Psychiatry  Anxiety -     Ambulatory referral to Psychiatry  Severe episode of recurrent major depressive disorder, without psychotic features (HCC) -     Ambulatory referral to Psychiatry  Overweight (BMI 25.0-29.9) -     tirzepatide (MOUNJARO) 7.5 MG/0.5ML Pen; Inject 7.5 mg into the skin once a week.  History of obesity -     tirzepatide (MOUNJARO) 7.5 MG/0.5ML Pen; Inject 7.5 mg into the skin once a week.  Pre-diabetes -     tirzepatide (MOUNJARO) 7.5 MG/0.5ML Pen; Inject 7.5 mg into the skin once a week.  Mixed hyperlipidemia -     tirzepatide (MOUNJARO) 7.5 MG/0.5ML Pen; Inject 7.5 mg into the skin once a week.   Referral placed for [facility].   BMI 24 weight  looks great Continue to work on stability Continue on 7.5mg  for next 3 months and follow up Continue 150 minutes of exercise weekly  Continue protein 80g a day   Follow Up Instructions:    I discussed the assessment and treatment plan with the patient. The patient was provided an opportunity to ask questions and all were answered. The patient agreed with the plan and demonstrated an understanding of the instructions.   The patient was advised to call back or seek an in-person evaluation if the symptoms worsen or if the condition fails to improve as anticipated.    Tandy Gaw, PA-C

## 2024-03-22 LAB — GENECONNECT MOLECULAR SCREEN: Genetic Analysis Overall Interpretation: NEGATIVE

## 2024-03-24 ENCOUNTER — Ambulatory Visit: Admitting: Podiatry

## 2024-03-24 ENCOUNTER — Encounter: Payer: Self-pay | Admitting: Podiatry

## 2024-03-24 DIAGNOSIS — L603 Nail dystrophy: Secondary | ICD-10-CM
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
## 2024-03-29 ENCOUNTER — Institutional Professional Consult (permissible substitution): Admitting: Plastic Surgery

## 2024-04-11 ENCOUNTER — Other Ambulatory Visit: Payer: Self-pay | Admitting: Physician Assistant

## 2024-04-11 DIAGNOSIS — Z8639 Personal history of other endocrine, nutritional and metabolic disease: Secondary | ICD-10-CM

## 2024-04-11 DIAGNOSIS — E663 Overweight: Secondary | ICD-10-CM

## 2024-04-11 DIAGNOSIS — E782 Mixed hyperlipidemia: Secondary | ICD-10-CM

## 2024-04-11 DIAGNOSIS — R7303 Prediabetes: Secondary | ICD-10-CM

## 2024-04-12 ENCOUNTER — Telehealth: Payer: Self-pay

## 2024-04-12 ENCOUNTER — Telehealth: Payer: Self-pay | Admitting: Physician Assistant

## 2024-04-12 NOTE — Telephone Encounter (Signed)
 Copied from CRM [id number]. Topic: Referral - Question >> Apr 12, 2024  9:27 AM Jayson Michael wrote: Reason for [id number] called regarding referral [phone] to [facility]. She stated the office reports they have not received the referral, which was faxed on 03/20/2024 at 1:11 PM.  Patient said she will call the office to confirm the correct fax number and will call back once she has it. She is requesting the referral be re-faxed once confirmed.

## 2024-04-12 NOTE — Telephone Encounter (Signed)
 Copied from CRM [id number]. Topic: Clinical - Medication Question >> Apr 12, 2024  9:31 AM Kevelyn M wrote: Reason for CRM: Pt calling about fax number for psych referral to give to the office.

## 2024-04-12 NOTE — Telephone Encounter (Signed)
 Spoke with patient. This has been resolved and not further action needed from our office.

## 2024-05-19 ENCOUNTER — Telehealth (INDEPENDENT_AMBULATORY_CARE_PROVIDER_SITE_OTHER): Admitting: Physician Assistant

## 2024-05-19 DIAGNOSIS — G4700 Insomnia, unspecified: Secondary | ICD-10-CM | POA: Diagnosis not present

## 2024-05-19 DIAGNOSIS — F419 Anxiety disorder, unspecified: Secondary | ICD-10-CM

## 2024-05-19 DIAGNOSIS — F332 Major depressive disorder, recurrent severe without psychotic features: Secondary | ICD-10-CM | POA: Diagnosis not present

## 2024-05-19 DIAGNOSIS — F3181 Bipolar II disorder: Secondary | ICD-10-CM

## 2024-05-19 MED ORDER — SERTRALINE HCL 100 MG PO TABS
100.0000 mg | ORAL_TABLET | Freq: Two times a day (BID) | ORAL | 0 refills | Status: AC
Start: 2024-05-19 — End: ?

## 2024-05-19 MED ORDER — QUETIAPINE FUMARATE 100 MG PO TABS: 100.0000 mg | ORAL_TABLET | Freq: Every day | ORAL | 0 refills | Status: DC

## 2024-05-19 MED ORDER — ZOLPIDEM TARTRATE ER 6.25 MG PO TBCR: 6.2500 mg | EXTENDED_RELEASE_TABLET | Freq: Every evening | ORAL | 0 refills | Status: DC | PRN

## 2024-05-19 MED ORDER — ARIPIPRAZOLE 15 MG PO TABS: 15.0000 mg | ORAL_TABLET | Freq: Every day | ORAL | 0 refills | Status: AC

## 2024-05-19 NOTE — Progress Notes (Signed)
..  Virtual Visit via Video Note  I connected with Kiesha Farewell on 05/19/24 at  9:50 AM EDT by a video enabled telemedicine application and verified that I am speaking with the correct person using two identifiers.  Location: Patient: home Provider: clinic  .Aaron AasParticipating in visit:  Patient: Debbie Hancock Provider: Sandy Crumb PA-C   I discussed the limitations of evaluation and management by telemedicine and the availability of in person appointments. The patient expressed understanding and agreed to proceed.  History of Present Illness: Pt is a 58 yo female with MDD, Bipolar 2, anxiety and insomnia who presents to the clinic to discuss new referral and get medication refills. Pt has been seen by psychiatrist and now they are out of network. She needs a new referral and medications to last her. She is doing good. No concerns right now.     Observations/Objective: No acute distress Normal mood and appearance Normal breathing   Assessment and Plan: Aaron AasAaron AasDiagnoses and all orders for this visit:  Severe episode of recurrent major depressive disorder, without psychotic features (HCC) -     ARIPiprazole  (ABILIFY ) 15 MG tablet; Take 1 tablet (15 mg total) by mouth daily. -     QUEtiapine  (SEROQUEL ) 100 MG tablet; Take 1 tablet (100 mg total) by mouth at bedtime. -     sertraline  (ZOLOFT ) 100 MG tablet; Take 1 tablet (100 mg total) by mouth in the morning and at bedtime. -     Ambulatory referral to Psychiatry  Bipolar 2 disorder (HCC) -     ARIPiprazole  (ABILIFY ) 15 MG tablet; Take 1 tablet (15 mg total) by mouth daily. -     QUEtiapine  (SEROQUEL ) 100 MG tablet; Take 1 tablet (100 mg total) by mouth at bedtime. -     sertraline  (ZOLOFT ) 100 MG tablet; Take 1 tablet (100 mg total) by mouth in the morning and at bedtime. -     Ambulatory referral to Psychiatry  Anxiety -     ARIPiprazole  (ABILIFY ) 15 MG tablet; Take 1 tablet (15 mg total) by mouth daily. -     QUEtiapine  (SEROQUEL ) 100 MG  tablet; Take 1 tablet (100 mg total) by mouth at bedtime. -     sertraline  (ZOLOFT ) 100 MG tablet; Take 1 tablet (100 mg total) by mouth in the morning and at bedtime. -     Ambulatory referral to Psychiatry  Insomnia, unspecified type -     QUEtiapine  (SEROQUEL ) 100 MG tablet; Take 1 tablet (100 mg total) by mouth at bedtime. -     zolpidem  (AMBIEN  CR) 6.25 MG CR tablet; Take 1 tablet (6.25 mg total) by mouth at bedtime as needed for sleep. -     Ambulatory referral to Psychiatry   Refilled medications needed today New referral was made to get [facility] in network   Follow Up Instructions:    I discussed the assessment and treatment plan with the patient. The patient was provided an opportunity to ask questions and all were answered. The patient agreed with the plan and demonstrated an understanding of the instructions.   The patient was advised to call back or seek an in-person evaluation if the symptoms worsen or if the condition fails to improve as anticipated.   Rolando Whitby, PA-C

## 2024-06-21 ENCOUNTER — Ambulatory Visit: Payer: Self-pay

## 2024-06-21 NOTE — Telephone Encounter (Signed)
 FYI Only or Action Required?: FYI only for provider.  Patient was last seen in primary care on 05/19/2024 by Antoniette Vermell CROME, PA-C.  Called Nurse Triage reporting Medication Questions.  Symptoms began N/a.  Interventions attempted: Other: N/A.  Symptoms are: N/A.  Triage Disposition: Information Only   Patient/caregiver understands and will follow disposition?: YES     **Patient reports falling x 2 weeks ago. She fell twice outside on the concrete and landed on BIL knees. She reports being clumsy. She reports falling in the past. The scrapes are healing.  The reason for the call was inform the PCP that she enjoys the Monjaro, and would like to continue taking it . She would like to further discuss. A video visit was scheduled for 7/15**                           Copied from CRM #[id number]. Topic: Clinical - Red Word Triage >> Jun 21, 2024  9:48 AM Zane F wrote: Red Word that prompted transfer to Nurse Triage:   Clemens twice in the last two weeks resulted in knee pain and knee scrap;   Looking for an appointment for Medication Review on Mounjaro 

## 2024-06-22 NOTE — Telephone Encounter (Signed)
 Patient has upcoming appt schld for 06/27/2024 with Vermell Bologna, PA

## 2024-06-27 ENCOUNTER — Telehealth (INDEPENDENT_AMBULATORY_CARE_PROVIDER_SITE_OTHER): Admitting: Physician Assistant

## 2024-06-27 ENCOUNTER — Encounter: Payer: Self-pay | Admitting: Physician Assistant

## 2024-06-27 VITALS — Ht 63.0 in | Wt 124.0 lb

## 2024-06-27 DIAGNOSIS — Z8639 Personal history of other endocrine, nutritional and metabolic disease: Secondary | ICD-10-CM | POA: Diagnosis not present

## 2024-06-27 DIAGNOSIS — M79672 Pain in left foot: Secondary | ICD-10-CM | POA: Diagnosis not present

## 2024-06-27 DIAGNOSIS — E663 Overweight: Secondary | ICD-10-CM

## 2024-06-27 DIAGNOSIS — R7303 Prediabetes: Secondary | ICD-10-CM

## 2024-06-27 DIAGNOSIS — E782 Mixed hyperlipidemia: Secondary | ICD-10-CM | POA: Diagnosis not present

## 2024-06-27 DIAGNOSIS — M79671 Pain in right foot: Secondary | ICD-10-CM

## 2024-06-27 DIAGNOSIS — F419 Anxiety disorder, unspecified: Secondary | ICD-10-CM | POA: Diagnosis not present

## 2024-06-27 MED ORDER — DICLOFENAC SODIUM 1 % EX GEL: 4.0000 g | Freq: Four times a day (QID) | CUTANEOUS | 1 refills | Status: AC

## 2024-06-27 MED ORDER — MOUNJARO 7.5 MG/0.5ML ~~LOC~~ SOAJ: 7.5000 mg | SUBCUTANEOUS | 1 refills | Status: DC

## 2024-06-27 NOTE — Progress Notes (Unsigned)
..  Virtual Visit via Video Note  I connected with Debbie Hancock on 06/27/24 at  9:10 AM EDT by a video enabled telemedicine application and verified that I am speaking with the correct person using two identifiers.  Location: Patient: home Provider: clinic  .SABRAParticipating in visit:  Patient: Debbie Hancock Provider: Vermell Bologna PA-C   I discussed the limitations of evaluation and management by telemedicine and the availability of in person appointments. The patient expressed understanding and agreed to proceed.  History of Present Illness: Pt is a 58 yo female who calls in to follow up on weight loss.   She has done amazing on mounjaro . She is on the 7.5mg  dose and feels the best. She has no side effects. She reports her food noise is gone and she is able to eat a healthy diet. She is very active and walks daily. She has lost 62lbs in total. She started at 186 and now 124. Her weight loss has helped with mood, joint pain, BP and even OAB.   Observations/Objective: No acute distress  Normal breathing Normal mood and appearance   Assessment and Plan: SABRASABRASharnika was seen today for medical management of chronic issues.  Diagnoses and all orders for this visit:  Pre-diabetes -     tirzepatide  (MOUNJARO ) 7.5 MG/0.5ML Pen; Inject 7.5 mg into the skin once a week.  Anxiety  Pain in both feet -     diclofenac  Sodium (VOLTAREN ) 1 % GEL; Apply 4 g topically 4 (four) times daily. APPLY FOUR GRAMS FOUR TIMES A DAY TO AFFECTED JOINT  Overweight (BMI 25.0-29.9) -     tirzepatide  (MOUNJARO ) 7.5 MG/0.5ML Pen; Inject 7.5 mg into the skin once a week.  History of obesity -     tirzepatide  (MOUNJARO ) 7.5 MG/0.5ML Pen; Inject 7.5 mg into the skin once a week.  Mixed hyperlipidemia -     tirzepatide  (MOUNJARO ) 7.5 MG/0.5ML Pen; Inject 7.5 mg into the skin once a week.   Continue on mounjaro  7.5mg  weekly.  Follow up in 6 months Labs UTD   Follow Up Instructions:    I discussed the  assessment and treatment plan with the patient. The patient was provided an opportunity to ask questions and all were answered. The patient agreed with the plan and demonstrated an understanding of the instructions.   The patient was advised to call back or seek an in-person evaluation if the symptoms worsen or if the condition fails to improve as anticipated.    Debbie Santoro, PA-C

## 2024-06-28 ENCOUNTER — Encounter: Payer: Self-pay | Admitting: Physician Assistant

## 2024-06-29 ENCOUNTER — Other Ambulatory Visit (HOSPITAL_COMMUNITY): Payer: Self-pay

## 2024-06-29 ENCOUNTER — Telehealth: Payer: Self-pay

## 2024-06-29 NOTE — Telephone Encounter (Signed)
 Pharmacy Patient Advocate Encounter   Received notification from CoverMyMeds that prior authorization for Mounjaro  7.5MG /0.5ML auto-injectors is required/requested.   Insurance verification completed.   The patient is insured through [employer] .   Per test claim: PA required and submitted KEY/EOC/Request #: [id number] due to : Authorization already on file for this request. Authorization starting on 12/15/2023 and ending on 12/13/2024.   Per test claim: Refill too soon. Last filled 06/17/24, next fill 07/11/24

## 2024-07-03 DIAGNOSIS — F3132 Bipolar disorder, current episode depressed, moderate: Secondary | ICD-10-CM | POA: Diagnosis not present

## 2024-07-03 DIAGNOSIS — F4312 Post-traumatic stress disorder, chronic: Secondary | ICD-10-CM | POA: Diagnosis not present

## 2024-07-06 ENCOUNTER — Telehealth: Payer: Self-pay | Admitting: Acute Care

## 2024-07-06 NOTE — Telephone Encounter (Signed)
 Left VM for pt to call for scheduling annual LDCT. Call [phone]

## 2024-07-19 DIAGNOSIS — F4312 Post-traumatic stress disorder, chronic: Secondary | ICD-10-CM | POA: Diagnosis not present

## 2024-07-19 DIAGNOSIS — F3132 Bipolar disorder, current episode depressed, moderate: Secondary | ICD-10-CM | POA: Diagnosis not present

## 2024-08-01 ENCOUNTER — Ambulatory Visit

## 2024-08-01 DIAGNOSIS — F17210 Nicotine dependence, cigarettes, uncomplicated: Secondary | ICD-10-CM

## 2024-08-01 DIAGNOSIS — Z122 Encounter for screening for malignant neoplasm of respiratory organs: Secondary | ICD-10-CM

## 2024-08-01 DIAGNOSIS — Z87891 Personal history of nicotine dependence: Secondary | ICD-10-CM

## 2024-08-02 DIAGNOSIS — F3132 Bipolar disorder, current episode depressed, moderate: Secondary | ICD-10-CM | POA: Diagnosis not present

## 2024-08-02 DIAGNOSIS — F4312 Post-traumatic stress disorder, chronic: Secondary | ICD-10-CM | POA: Diagnosis not present

## 2024-08-10 ENCOUNTER — Encounter: Payer: Self-pay | Admitting: Physician Assistant

## 2024-08-15 ENCOUNTER — Other Ambulatory Visit: Payer: Self-pay | Admitting: Acute Care

## 2024-08-15 DIAGNOSIS — Z122 Encounter for screening for malignant neoplasm of respiratory organs: Secondary | ICD-10-CM

## 2024-08-15 DIAGNOSIS — Z87891 Personal history of nicotine dependence: Secondary | ICD-10-CM

## 2024-08-15 DIAGNOSIS — F17210 Nicotine dependence, cigarettes, uncomplicated: Secondary | ICD-10-CM

## 2024-08-16 DIAGNOSIS — F4312 Post-traumatic stress disorder, chronic: Secondary | ICD-10-CM | POA: Diagnosis not present

## 2024-08-16 DIAGNOSIS — F3132 Bipolar disorder, current episode depressed, moderate: Secondary | ICD-10-CM | POA: Diagnosis not present

## 2024-08-19 ENCOUNTER — Other Ambulatory Visit: Payer: Self-pay | Admitting: Physician Assistant

## 2024-08-19 DIAGNOSIS — N3281 Overactive bladder: Secondary | ICD-10-CM

## 2024-08-22 DIAGNOSIS — F4312 Post-traumatic stress disorder, chronic: Secondary | ICD-10-CM | POA: Diagnosis not present

## 2024-08-22 DIAGNOSIS — F3132 Bipolar disorder, current episode depressed, moderate: Secondary | ICD-10-CM | POA: Diagnosis not present

## 2024-08-30 DIAGNOSIS — F4312 Post-traumatic stress disorder, chronic: Secondary | ICD-10-CM | POA: Diagnosis not present

## 2024-08-30 DIAGNOSIS — F3132 Bipolar disorder, current episode depressed, moderate: Secondary | ICD-10-CM | POA: Diagnosis not present

## 2024-09-01 ENCOUNTER — Telehealth: Payer: Self-pay

## 2024-09-01 NOTE — Telephone Encounter (Signed)
 Per [facility] - clinical concern for nortriptyline  50 mg - has the patient had any monitoring for Qtc prolongation? Several of her anti-depressants/anti-psychotic used in combination can prolong QTC. Rx on hold until response from provider. Please advise, thanks.

## 2024-09-04 ENCOUNTER — Encounter: Payer: Self-pay | Admitting: Physician Assistant

## 2024-09-04 ENCOUNTER — Telehealth (INDEPENDENT_AMBULATORY_CARE_PROVIDER_SITE_OTHER): Admitting: Physician Assistant

## 2024-09-04 VITALS — Wt 120.0 lb

## 2024-09-04 DIAGNOSIS — E782 Mixed hyperlipidemia: Secondary | ICD-10-CM

## 2024-09-04 DIAGNOSIS — M329 Systemic lupus erythematosus, unspecified: Secondary | ICD-10-CM

## 2024-09-04 DIAGNOSIS — Z8639 Personal history of other endocrine, nutritional and metabolic disease: Secondary | ICD-10-CM | POA: Diagnosis not present

## 2024-09-04 DIAGNOSIS — F332 Major depressive disorder, recurrent severe without psychotic features: Secondary | ICD-10-CM

## 2024-09-04 DIAGNOSIS — F419 Anxiety disorder, unspecified: Secondary | ICD-10-CM

## 2024-09-04 DIAGNOSIS — M069 Rheumatoid arthritis, unspecified: Secondary | ICD-10-CM

## 2024-09-04 DIAGNOSIS — K760 Fatty (change of) liver, not elsewhere classified: Secondary | ICD-10-CM

## 2024-09-04 DIAGNOSIS — F3181 Bipolar II disorder: Secondary | ICD-10-CM | POA: Diagnosis not present

## 2024-09-04 DIAGNOSIS — R7303 Prediabetes: Secondary | ICD-10-CM | POA: Diagnosis not present

## 2024-09-04 DIAGNOSIS — E559 Vitamin D deficiency, unspecified: Secondary | ICD-10-CM

## 2024-09-04 DIAGNOSIS — G4700 Insomnia, unspecified: Secondary | ICD-10-CM

## 2024-09-04 DIAGNOSIS — E781 Pure hyperglyceridemia: Secondary | ICD-10-CM

## 2024-09-04 MED ORDER — QUETIAPINE FUMARATE 100 MG PO TABS
100.0000 mg | ORAL_TABLET | Freq: Two times a day (BID) | ORAL | Status: AC
Start: 2024-09-04 — End: ?

## 2024-09-04 NOTE — Progress Notes (Signed)
 ..Virtual Visit via Video Note  I connected with Debbie Hancock on 09/04/24 at  9:10 AM EDT by a video enabled telemedicine application and verified that I am speaking with the correct person using two identifiers.  Location: Patient: home Provider: clinic  .SABRAParticipating in visit:  Patient: Debbie Hancock Provider: Vermell Bologna PA-C   I discussed the limitations of evaluation and management by telemedicine and the availability of in person appointments. The patient expressed understanding and agreed to proceed.  History of Present Illness: Pt is a 58 yo female who calls into the clinic to go over medications and make sure lab work is done.   Pt is seeing behavioral health right now and they are managing medications and sending refills for her mental health medications.   She is doing great with weight loss. She continues to eat healthy and exercise daily. She feels great. She is on mounjaro  7.5mg  weekly.     Observations/Objective: No acute distress Normal mood and appearance Normal breathing   .SABRA Today's Vitals   09/04/24 0916  Weight: 120 lb (54.4 kg)   Body mass index is 21.26 kg/m.     Assessment and Plan: SABRASABRAIliza was seen today for medical management of chronic issues.  Diagnoses and all orders for this visit:  Pre-diabetes -     CMP14+EGFR -     Hemoglobin A1c  Severe episode of recurrent major depressive disorder, without psychotic features (HCC) -     CMP14+EGFR -     QUEtiapine  (SEROQUEL ) 100 MG tablet; Take 1 tablet (100 mg total) by mouth 2 (two) times daily.  History of obesity -     Hemoglobin A1c  Hypertriglyceridemia -     CMP14+EGFR -     Lipid panel -     Hemoglobin A1c  Systemic lupus erythematosus, unspecified SLE type, unspecified organ involvement status (HCC) -     CMP14+EGFR  Bipolar 2 disorder (HCC) -     CMP14+EGFR -     QUEtiapine  (SEROQUEL ) 100 MG tablet; Take 1 tablet (100 mg total) by mouth 2 (two) times daily.  Rheumatoid  arthritis, involving unspecified site, unspecified whether rheumatoid factor present (HCC) -     CMP14+EGFR -     CBC w/Diff/Platelet  Mixed hyperlipidemia -     Lipid panel  Fatty liver disease, nonalcoholic -     CMP14+EGFR -     Lipid panel -     CBC w/Diff/Platelet  Vitamin D deficiency -     VITAMIN D 25 Hydroxy (Vit-D Deficiency, Fractures)  Anxiety -     QUEtiapine  (SEROQUEL ) 100 MG tablet; Take 1 tablet (100 mg total) by mouth 2 (two) times daily.  Insomnia, unspecified type -     QUEtiapine  (SEROQUEL ) 100 MG tablet; Take 1 tablet (100 mg total) by mouth 2 (two) times daily.   Labs for medication management ordered Psychiatry to take over prescribing mental health medications Pt's weight is stable Continue to eat healthy and exercise daily Hx of elevated liver enzymes and fatty liver she wants to make sure labs are showing this is improving After 3 months consider decreasing down to 5mg  weekly    Follow Up Instructions:    I discussed the assessment and treatment plan with the patient. The patient was provided an opportunity to ask questions and all were answered. The patient agreed with the plan and demonstrated an understanding of the instructions.   The patient was advised to call back or seek an in-person evaluation if the symptoms  worsen or if the condition fails to improve as anticipated.  Fenton Candee, PA-C

## 2024-09-04 NOTE — Telephone Encounter (Signed)
 I do not manage her mental health medications any more. They need to contact her psychiatrist.

## 2024-09-07 ENCOUNTER — Encounter: Payer: Self-pay | Admitting: Physician Assistant

## 2024-09-07 NOTE — Telephone Encounter (Signed)
 Per [facility] - since the nortriptyline  was written by the provider, the clinic concern request response is required by the provider. Please advise, thanks.

## 2024-09-15 DIAGNOSIS — F3181 Bipolar II disorder: Secondary | ICD-10-CM | POA: Diagnosis not present

## 2024-09-15 DIAGNOSIS — M329 Systemic lupus erythematosus, unspecified: Secondary | ICD-10-CM | POA: Diagnosis not present

## 2024-09-15 DIAGNOSIS — R7303 Prediabetes: Secondary | ICD-10-CM | POA: Diagnosis not present

## 2024-09-15 DIAGNOSIS — Z8639 Personal history of other endocrine, nutritional and metabolic disease: Secondary | ICD-10-CM | POA: Diagnosis not present

## 2024-09-15 DIAGNOSIS — E781 Pure hyperglyceridemia: Secondary | ICD-10-CM | POA: Diagnosis not present

## 2024-09-15 DIAGNOSIS — F332 Major depressive disorder, recurrent severe without psychotic features: Secondary | ICD-10-CM | POA: Diagnosis not present

## 2024-09-15 DIAGNOSIS — K760 Fatty (change of) liver, not elsewhere classified: Secondary | ICD-10-CM | POA: Diagnosis not present

## 2024-09-15 DIAGNOSIS — M069 Rheumatoid arthritis, unspecified: Secondary | ICD-10-CM | POA: Diagnosis not present

## 2024-09-15 DIAGNOSIS — E782 Mixed hyperlipidemia: Secondary | ICD-10-CM | POA: Diagnosis not present

## 2024-09-16 LAB — CBC WITH DIFFERENTIAL/PLATELET
Basophils Absolute: 0.1 x10E3/uL (ref 0.0–0.2)
Basos: 1 %
EOS (ABSOLUTE): 0.3 x10E3/uL (ref 0.0–0.4)
Eos: 4 %
Hematocrit: 42 % (ref 34.0–46.6)
Hemoglobin: 13.7 g/dL (ref 11.1–15.9)
Immature Grans (Abs): 0 x10E3/uL (ref 0.0–0.1)
Immature Granulocytes: 0 %
Lymphocytes Absolute: 2.5 x10E3/uL (ref 0.7–3.1)
Lymphs: 36 %
MCH: 31.2 pg (ref 26.6–33.0)
MCHC: 32.6 g/dL (ref 31.5–35.7)
MCV: 96 fL (ref 79–97)
Monocytes Absolute: 0.5 x10E3/uL (ref 0.1–0.9)
Monocytes: 7 %
Neutrophils Absolute: 3.6 x10E3/uL (ref 1.4–7.0)
Neutrophils: 52 %
Platelets: 253 x10E3/uL (ref 150–450)
RBC: 4.39 x10E6/uL (ref 3.77–5.28)
RDW: 12.5 % (ref 11.7–15.4)
WBC: 6.9 x10E3/uL (ref 3.4–10.8)

## 2024-09-16 LAB — CMP14+EGFR
ALT: 31 IU/L (ref 0–32)
AST: 17 IU/L (ref 0–40)
Albumin: 4.1 g/dL (ref 3.8–4.9)
Alkaline Phosphatase: 95 IU/L (ref 49–135)
BUN/Creatinine Ratio: 14 (ref 9–23)
BUN: 12 mg/dL (ref 6–24)
Bilirubin Total: 0.3 mg/dL (ref 0.0–1.2)
CO2: 23 mmol/L (ref 20–29)
Calcium: 9.2 mg/dL (ref 8.7–10.2)
Chloride: 104 mmol/L (ref 96–106)
Creatinine, Ser: 0.88 mg/dL (ref 0.57–1.00)
Globulin, Total: 2.4 g/dL (ref 1.5–4.5)
Glucose: 82 mg/dL (ref 70–99)
Potassium: 4.7 mmol/L (ref 3.5–5.2)
Sodium: 144 mmol/L (ref 134–144)
Total Protein: 6.5 g/dL (ref 6.0–8.5)
eGFR: 76 mL/min/1.73 (ref >59)

## 2024-09-16 LAB — VITAMIN D 25 HYDROXY (VIT D DEFICIENCY, FRACTURES): Vit D, 25-Hydroxy: 24.3 ng/mL — ABNORMAL LOW (ref 30.0–100.0)

## 2024-09-16 LAB — LIPID PANEL
Chol/HDL Ratio: 4.5 ratio — ABNORMAL HIGH (ref 0.0–4.4)
Cholesterol, Total: 302 mg/dL — ABNORMAL HIGH (ref 100–199)
HDL: 67 mg/dL (ref >39)
LDL Chol Calc (NIH): 206 mg/dL — ABNORMAL HIGH (ref 0–99)
Triglycerides: 156 mg/dL — ABNORMAL HIGH (ref 0–149)
VLDL Cholesterol Cal: 29 mg/dL (ref 5–40)

## 2024-09-16 LAB — HEMOGLOBIN A1C
Est. average glucose Bld gHb Est-mCnc: 100 mg/dL
Hgb A1c MFr Bld: 5.1 % (ref 4.8–5.6)

## 2024-09-18 ENCOUNTER — Ambulatory Visit: Payer: Self-pay | Admitting: Physician Assistant

## 2024-09-18 NOTE — Progress Notes (Signed)
 Debbie Hancock,   Your LDL increased a lot!! By 100 points. What changed? Did you stop lipitor?   Vitamin D low. How much are you taking and how are you taking it?   A1C in normal range.   Kidney, liver, glucose look good.   Normal hemoglobin.

## 2024-09-20 DIAGNOSIS — F3132 Bipolar disorder, current episode depressed, moderate: Secondary | ICD-10-CM | POA: Diagnosis not present

## 2024-09-20 DIAGNOSIS — F4312 Post-traumatic stress disorder, chronic: Secondary | ICD-10-CM | POA: Diagnosis not present

## 2024-10-12 ENCOUNTER — Ambulatory Visit: Payer: Self-pay

## 2024-10-12 NOTE — Telephone Encounter (Signed)
 FYI Only or Action Required?: FYI only for provider: Urgent Care advised.  Patient was last seen in primary care on 09/04/2024 by Debbie Vermell CROME, PA-C.  Called Nurse Triage reporting Dysuria.  Symptoms began 2 days ago.  Interventions attempted: Rest, hydration, or home remedies.  Symptoms are: gradually worsening.  Triage Disposition: See Physician Within 24 Hours  Patient/caregiver understands and will follow disposition?: No appointment, patient in [place] , advised urgent care       Copied from CRM #[id number]. Topic: Clinical - Red Word Triage >> Oct 12, 2024 12:12 PM Debbie Hancock wrote: [facility] that prompted transfer to Nurse Triage: Patient believes she has a bladder in infection. Lower back pain, can't empty bladder,burning, very painful.       Reason for Disposition  Age > 50 years  Answer Assessment - Initial Assessment Questions Patient currently in [place]  and requested medication be sent to a nearby pharmacy. Patient advised to go to an urgent care for evaluation and treatment. Patient verbalized understanding and agreement with this plan.      1. SEVERITY: How bad is the pain?  (e.g., Scale 1-10; mild, moderate, or severe)     Moderate  3. PATTERN: Is pain present every time you urinate or just sometimes?      Every urination  4. ONSET: When did the painful urination start?      2 days ago  5. FEVER: Do you have a fever? If Yes, ask: What is your temperature, how was it measured, and when did it start?     Low grade fever  6. PAST UTI: Have you had a urine infection before? If Yes, ask: When was the last time? and What happened that time?      Yes, similar  7. CAUSE: What do you think is causing the painful urination?  (e.g., UTI, scratch, Herpes sore)     Believes due to a UTI 8. OTHER SYMPTOMS: Do you have any other symptoms? (e.g., blood in urine, flank pain, genital sores, urgency, vaginal discharge)     Lower abdominal and back pain,  bladder fullness  Protocols used: Urination Pain - Female-A-AH

## 2024-10-13 NOTE — Telephone Encounter (Signed)
 Attempted call to patient. Left a voice mail message requesting a return call.

## 2024-10-16 NOTE — Telephone Encounter (Signed)
 Per Vermell Bologna, PA can do a virtual appt fist thing tomorrow  Attempted call to patient. Left a voice mail message requesting a return call.

## 2024-10-16 NOTE — Telephone Encounter (Signed)
 Spoke with patient. She is in [place]  and will not be returning until  Tuesday after 5pm-  States she did attempt an Urgent care in [place] but they did not accept her insurance there and would cost her $400 She has been using [facility] for symptoms.  Told her that if symptoms worsen she would still need to be seen asap-  she did state that if they did get worse she would go to the ER in [place] .

## 2024-10-17 NOTE — Telephone Encounter (Signed)
 Patient was offered a virtual appt for tomorrow with Jade Breeback but [place] of the call. States her phone service cuts in and out there. She denied the offer for the visit today but did  make an appt for tomorrow at 10:10 am with Vermell Bologna , PA

## 2024-10-18 ENCOUNTER — Encounter: Payer: Self-pay | Admitting: Physician Assistant

## 2024-10-18 ENCOUNTER — Telehealth: Payer: Self-pay

## 2024-10-18 ENCOUNTER — Ambulatory Visit (INDEPENDENT_AMBULATORY_CARE_PROVIDER_SITE_OTHER): Admitting: Physician Assistant

## 2024-10-18 VITALS — BP 110/70 | HR 109 | Temp 98.6°F | Ht 63.0 in | Wt 113.0 lb

## 2024-10-18 DIAGNOSIS — N3000 Acute cystitis without hematuria: Secondary | ICD-10-CM

## 2024-10-18 DIAGNOSIS — F332 Major depressive disorder, recurrent severe without psychotic features: Secondary | ICD-10-CM

## 2024-10-18 DIAGNOSIS — R1030 Lower abdominal pain, unspecified: Secondary | ICD-10-CM | POA: Diagnosis not present

## 2024-10-18 DIAGNOSIS — G4700 Insomnia, unspecified: Secondary | ICD-10-CM | POA: Diagnosis not present

## 2024-10-18 DIAGNOSIS — N898 Other specified noninflammatory disorders of vagina: Secondary | ICD-10-CM | POA: Diagnosis not present

## 2024-10-18 DIAGNOSIS — N3010 Interstitial cystitis (chronic) without hematuria: Secondary | ICD-10-CM

## 2024-10-18 DIAGNOSIS — R300 Dysuria: Secondary | ICD-10-CM

## 2024-10-18 DIAGNOSIS — F3181 Bipolar II disorder: Secondary | ICD-10-CM

## 2024-10-18 LAB — POCT URINALYSIS DIP (CLINITEK)
Bilirubin, UA: NEGATIVE
Blood, UA: NEGATIVE
Glucose, UA: NEGATIVE mg/dL
Ketones, POC UA: NEGATIVE mg/dL
Leukocytes, UA: NEGATIVE
Nitrite, UA: NEGATIVE
POC PROTEIN,UA: 30 — AB
Spec Grav, UA: 1.025 (ref 1.010–1.025)
Urobilinogen, UA: 0.2 U/dL
pH, UA: 6 (ref 5.0–8.0)

## 2024-10-18 MED ORDER — PHENAZOPYRIDINE HCL 200 MG PO TABS: 200.0000 mg | ORAL_TABLET | Freq: Three times a day (TID) | ORAL | 0 refills | Status: AC

## 2024-10-18 MED ORDER — KETOROLAC TROMETHAMINE 60 MG/2ML IM SOLN
60.0000 mg | Freq: Once | INTRAMUSCULAR | Status: AC
  Administered 2024-10-18: 60 mg via INTRAMUSCULAR

## 2024-10-18 MED ORDER — TIRZEPATIDE 5 MG/0.5ML ~~LOC~~ SOAJ: 5.0000 mg | SUBCUTANEOUS | 0 refills | Status: DC

## 2024-10-18 MED ORDER — TRAMADOL HCL 50 MG PO TABS: 50.0000 mg | ORAL_TABLET | Freq: Four times a day (QID) | ORAL | 0 refills | Status: DC | PRN

## 2024-10-18 MED ORDER — ZOLPIDEM TARTRATE ER 6.25 MG PO TBCR: 6.2500 mg | EXTENDED_RELEASE_TABLET | Freq: Every evening | ORAL | 0 refills | Status: AC | PRN

## 2024-10-18 NOTE — Patient Instructions (Signed)
 Interstitial Cystitis  Interstitial cystitis (IC) is inflammation of the bladder. It's also called painful bladder syndrome. It can cause pain near your bladder. It can also make you have to pee urgently and often. IC may flare up and then go away for a while. In some cases, it may become a long-term (chronic) problem. What are the causes? The cause of IC isn't known. What increases the risk? You may be more likely to get IC if: You're female. You have certain other conditions. These include: Fibromyalgia. Irritable bowel syndrome (IBS). Endometriosis. Chronic fatigue syndrome. You may have worse symptoms if: You're under a lot of stress. You smoke. You have certain foods or drinks. What are the signs or symptoms? Your symptoms may change over time. They may include: Discomfort or pain near your bladder. The pain may range from mild to very bad. You may have more or less pain as your bladder fills with pee and empties. Pain in your pelvic area. This is the area between your hip bones. Needing to pee often or all the time. Pain when you pee. Pain during sex. Blood in your pee. Tiredness. If you're female, your symptoms may get worse when you have your menstrual period. How is this diagnosed? IC is diagnosed based on your symptoms, your medical history, and an exam. Your health care provider may need to rule out other conditions. They may do tests, such as: Pee tests. A cystoscopy. This test looks at the inside of your bladder. Biopsy. This is when a small piece of tissue is removed from your bladder for testing. How is this treated? There's no cure for IC. But treatment can help you manage your symptoms. Work with your provider to find the best treatments for you. These may include: Medicines to help with pain or to reduce how often you feel the need to pee. These may be given by mouth or put in your bladder using a soft tube called a catheter. Diet changes. Taking steps to  manage stress. Physical therapy. This may include: Exercises. These can help you relax your pelvic floor muscles. Massage. This may be done to relax tight muscles. Bladder training. This is when you learn ways to control when you pee. Neuromodulation therapy. This uses a device that's put on your back. It blocks the nerves that cause you to feel pain near your bladder. A procedure to stretch your bladder. This may be done by filling your bladder with air or fluid. Surgery. This is rare. It's only done if other treatments don't help. Follow these instructions at home: Eating and drinking Make changes to your diet as told by your provider. You may need to avoid: Spicy foods. Foods with acid in them, like tomatoes or citrus. Foods with a lot of potassium in them. Avoid drinking alcohol and caffeine. These drinks can make you have to pee more. Lifestyle Learn and practice ways to relax. These may include deep breathing and muscle relaxation. Get care for your mental well-being. You may need to: Do cognitive behavioral therapy (CBT). This therapy can change the way you think or act in response to things. It may help you feel better. See a therapist if you're depressed. Work with your provider on other ways to manage pain. Acupuncture may help. Do not use any products that contain nicotine or tobacco. These products include cigarettes, chewing tobacco, and vaping devices, such as e-cigarettes. If you need help quitting, ask your provider. Bladder training  Do bladder training as told. You  may need to: Pee at set, regular times. Train yourself to delay peeing. Keep a bladder diary. Write down: The times you pee. Any symptoms you have. The diary can help you find out what makes your symptoms worse. Use the diary to schedule times to pee. If you're away from home, plan to be near a bathroom at those times. Make sure you pee: Just before you leave the house. Just before you go to  bed. General instructions Take over-the-counter and prescription medicines only as told by your provider. Try putting a warm or cool cloth called a compress over your bladder. This can help with pain. Avoid wearing tight clothes. Do exercises as told by your provider. Where to find more information [employer]: urologyhealth.org Interstitial Cystitis Association (ICA): [employer] Contact a health care provider if: Your symptoms don't get better with treatment. Your pain or discomfort gets worse. You have to pee more often. You have little to no control over when you pee. You have a fever or chills. This information is not intended to replace advice given to you by your health care provider. Make sure you discuss any questions you have with your health care provider. Document Revised: 03/06/2023 Document Reviewed: 03/06/2023 Elsevier Patient Education  2024 [employer].

## 2024-10-18 NOTE — Telephone Encounter (Signed)
 Patient continues to lose weight and past goal weight and hoping to regulate on a lower dose. Her glucose is well controlled.

## 2024-10-18 NOTE — Progress Notes (Signed)
 Acute Office Visit  Subjective:     Patient ID: Debbie Hancock, female    DOB: 03-18-1966, 58 y.o.   MRN: [id number]  Chief Complaint  Patient presents with   Dysuria    HPI .Discussed the use of AI scribe software for clinical note transcription with the patient, who gave verbal consent to proceed.  History of Present Illness Debbie Hancock is a 58 year old female with interstitial cystitis who presents with severe bladder pain and anxiety.  Bladder pain and urinary symptoms - Severe bladder pain, primarily located in the lower back - Symptoms began during a recent trip to [place]  - No recent sexual activity for seven years - Drinking plenty of water to manage symptoms  Systemic and constitutional symptoms - Cold sweats with alternating sensations of feeling hot and cold - No current fever, but fever of approximately 100F five days ago - No vomiting - Significant weight loss and poor appetite, with minimal oral intake  Dermatologic symptoms - Recent episode of hives between the legs with significant itching - No current hives  Psychological symptoms - History of anxiety and depression, which exacerbate bladder symptoms - Severe emotional distress and mental pain - Frequent crying and feeling overwhelmed - Difficulty with sleep due to anxiety - Previous use of Ambien  in conjunction with Seroquel  for sleep  Glycemic control - Currently taking all prescribed medications, including Mounjaro  - Concern about weight loss, feels like she is losing too much weight.     ROS See HPI.     Objective:    BP 110/70   Pulse (!) 109   Temp 98.6 F (37 C) (Oral)   Ht 5' 3 (1.6 m)   Wt 113 lb (51.3 kg)   SpO2 99%   BMI 20.02 kg/m  BP Readings from Last 3 Encounters:  10/18/24 110/70  01/12/24 118/73  11/19/23 103/71   Wt Readings from Last 3 Encounters:  10/18/24 113 lb (51.3 kg)  09/04/24 120 lb (54.4 kg)  08/01/24 124 lb (56.2 kg)   .SABRA    10/20/2024    7:49  AM 05/19/2024   10:05 AM 03/20/2024    8:12 AM 03/14/2024    8:06 AM 02/26/2023    8:05 AM  Depression screen PHQ 2/9  Decreased Interest 2 1 1 1 1   Down, Depressed, Hopeless 2 1 1 2  0  PHQ - 2 Score 4 2 2 3 1   Altered sleeping 2 3 3 1    Tired, decreased energy 3 3 1 1    Change in appetite 3 1 0 1   Feeling bad or failure about yourself  3 0 0 1   Trouble concentrating 2 2 1 2    Moving slowly or fidgety/restless 2 0 0 1   Suicidal thoughts 0 0 0 0   PHQ-9 Score 19 11  7  10     Difficult doing work/chores Very difficult Somewhat difficult Somewhat difficult Somewhat difficult      Data saved with a previous flowsheet row definition   .SABRA    10/20/2024    7:49 AM 05/19/2024   10:06 AM 03/20/2024    8:14 AM 01/28/2022    9:20 AM  GAD 7 : Generalized Anxiety Score  Nervous, Anxious, on Edge 2 1 3 2   Control/stop worrying 2 1 3 1   Worry too much - different things 2 1 3 1   Trouble relaxing 2 3 1 1   Restless 2 3 1 1   Easily annoyed or irritable 2 3  1 0  Afraid - awful might happen 2 3 0 1  Total GAD 7 Score 14 15 12 7   Anxiety Difficulty Very difficult Somewhat difficult Somewhat difficult       Physical Exam HENT:     Head: Normocephalic.  Cardiovascular:     Rate and Rhythm: Normal rate and regular rhythm.  Pulmonary:     Effort: Pulmonary effort is normal.     Breath sounds: Normal breath sounds.  Abdominal:     General: Bowel sounds are normal. There is no distension.     Palpations: Abdomen is soft. There is no mass.     Tenderness: There is abdominal tenderness. There is no right CVA tenderness, left CVA tenderness, guarding or rebound.     Comments: Lower abdomen tenderness.   Musculoskeletal:     Right lower leg: No edema.     Left lower leg: No edema.  Neurological:     Mental Status: She is alert.  Psychiatric:     Comments: tearful     Results for orders placed or performed in visit on 10/18/24  POCT URINALYSIS DIP (CLINITEK)  Result Value Ref Range   Color,  UA other (A) yellow   Clarity, UA clear clear   Glucose, UA negative negative mg/dL   Bilirubin, UA negative negative   Ketones, POC UA negative negative mg/dL   Spec Grav, UA 8.974 8.989 - 1.025   Blood, UA negative negative   pH, UA 6.0 5.0 - 8.0   POC PROTEIN,UA =30 (A) negative, trace   Urobilinogen, UA 0.2 0.2 or 1.0 E.U./dL   Nitrite, UA Negative Negative   Leukocytes, UA Negative Negative        Assessment & Plan:  SABRASABRAChrishelle was seen today for dysuria.  Diagnoses and all orders for this visit:  Interstitial cystitis -     POCT URINALYSIS DIP (CLINITEK) -     Urine Culture -     phenazopyridine  (PYRIDIUM ) 200 MG tablet; Take 1 tablet (200 mg total) by mouth 3 (three) times daily for 2 days. -     traMADol (ULTRAM) 50 MG tablet; Take 1 tablet (50 mg total) by mouth every 6 (six) hours as needed for up to 5 days. -     CMP14+EGFR -     Lipase -     CBC w/Diff/Platelet  Acute cystitis without hematuria -     POCT URINALYSIS DIP (CLINITEK) -     Urine Culture -     NuSwab Vaginitis Plus (VG+) -     traMADol (ULTRAM) 50 MG tablet; Take 1 tablet (50 mg total) by mouth every 6 (six) hours as needed for up to 5 days. -     CMP14+EGFR -     Lipase -     CBC w/Diff/Platelet  Severe episode of recurrent major depressive disorder, without psychotic features (HCC)  Bipolar 2 disorder (HCC)  Insomnia, unspecified type -     zolpidem  (AMBIEN  CR) 6.25 MG CR tablet; Take 1 tablet (6.25 mg total) by mouth at bedtime as needed for sleep.  Lower abdominal pain -     CMP14+EGFR -     Lipase -     CBC w/Diff/Platelet  Vaginal discharge -     POCT URINALYSIS DIP (CLINITEK) -     Urine Culture -     NuSwab Vaginitis Plus (VG+)   Assessment & Plan Interstitial cystitis with dysuria Chronic interstitial cystitis with recent exacerbation. Urinalysis negative for infection and only  positive for protein, possible anxiety-related exacerbation. - Administered Toradol injection for  bladder pain. - Prescribed Pyridium  for 3 days. - Prescribed tramadol for pain management during flare. - Ordered urine culture. - Ordered vaginal swab. - Encouraged increased water intake.  Depression PHQ/GAD not to goal Exacerbated by stress and anxiety. Symptoms include mental pain and crying spells. Current medication includes Seroquel . - Prescribed Ambien  temporarily to use in combination.  - Continue Seroquel . - encouraged her to make appt with counselor  Insomnia Likely related to anxiety and depression. Current medication includes Seroquel . - Prescribed Ambien  temporarily. - Continue Seroquel .  Mounjaro  dose decrease to 5mg  to help mitigate too much weight loss - A1C to goal   Follow up in 4 weeks  Vermell Bologna, PA-C

## 2024-10-18 NOTE — Telephone Encounter (Signed)
 The pharmacy wants to know the rationale why the patient is decreasing in the dose. Please advise, thanks.    Copied from CRM [id number]. Topic: Clinical - Prescription Issue >> Oct 18, 2024  1:28 PM Anairis L wrote: Reason for CRM: Pharmacist Delon wants clarification on tirzepatide  (MOUNJARO ) 5 MG/0.5ML Pen dosage decrease sent in today.   [facility] [id number] - [place], [place] - [address] [place] [address] Phone: [phone] Fax: [phone]   Thank you.

## 2024-10-18 NOTE — Telephone Encounter (Signed)
 This task has been completed as requested. Please review other TE for additional information. Pharmacist Delon at [facility] has been updated of the provider's note. Mounjaro  rx 5 mg will be placed on hold because the patient pick up a 3 mths supply for the 7.5 mg today. No further action needed.

## 2024-10-19 ENCOUNTER — Telehealth: Payer: Self-pay | Admitting: Physician Assistant

## 2024-10-19 LAB — LIPASE: Lipase: 43 U/L (ref 14–72)

## 2024-10-19 LAB — CMP14+EGFR
ALT: 16 IU/L (ref 0–32)
AST: 18 IU/L (ref 0–40)
Albumin: 4.5 g/dL (ref 3.8–4.9)
Alkaline Phosphatase: 72 IU/L (ref 49–135)
BUN/Creatinine Ratio: 14 (ref 9–23)
BUN: 14 mg/dL (ref 6–24)
Bilirubin Total: 0.3 mg/dL (ref 0.0–1.2)
CO2: 25 mmol/L (ref 20–29)
Calcium: 10.3 mg/dL — ABNORMAL HIGH (ref 8.7–10.2)
Chloride: 101 mmol/L (ref 96–106)
Creatinine, Ser: 1.01 mg/dL — ABNORMAL HIGH (ref 0.57–1.00)
Globulin, Total: 2.7 g/dL (ref 1.5–4.5)
Glucose: 99 mg/dL (ref 70–99)
Potassium: 3.8 mmol/L (ref 3.5–5.2)
Sodium: 141 mmol/L (ref 134–144)
Total Protein: 7.2 g/dL (ref 6.0–8.5)
eGFR: 64 mL/min/1.73 (ref >59)

## 2024-10-19 LAB — CBC WITH DIFFERENTIAL/PLATELET
Basophils Absolute: 0.1 x10E3/uL (ref 0.0–0.2)
Basos: 1 %
EOS (ABSOLUTE): 0.2 x10E3/uL (ref 0.0–0.4)
Eos: 2 %
Hematocrit: 45.6 % (ref 34.0–46.6)
Hemoglobin: 15.3 g/dL (ref 11.1–15.9)
Immature Grans (Abs): 0 x10E3/uL (ref 0.0–0.1)
Immature Granulocytes: 0 %
Lymphocytes Absolute: 2.5 x10E3/uL (ref 0.7–3.1)
Lymphs: 31 %
MCH: 31.7 pg (ref 26.6–33.0)
MCHC: 33.6 g/dL (ref 31.5–35.7)
MCV: 95 fL (ref 79–97)
Monocytes Absolute: 0.7 x10E3/uL (ref 0.1–0.9)
Monocytes: 9 %
Neutrophils Absolute: 4.4 x10E3/uL (ref 1.4–7.0)
Neutrophils: 57 %
Platelets: 284 x10E3/uL (ref 150–450)
RBC: 4.82 x10E6/uL (ref 3.77–5.28)
RDW: 12.4 % (ref 11.7–15.4)
WBC: 7.8 x10E3/uL (ref 3.4–10.8)

## 2024-10-19 NOTE — Telephone Encounter (Signed)
 Copied from CRM [id number]. Topic: General - Other >> Oct 19, 2024  1:12 PM Avram MATSU wrote: Reason for CRM: patient called to speak with kim only, was informed kim will give her a call.

## 2024-10-19 NOTE — Telephone Encounter (Signed)
 Spoke with patient. She is requesting a stronger pain medication than Tramadol.  She had sent a Mychart message regarding this to the provider.  I did tell her that the message had just been received but once Debbie Hancock had reviewed it  we would give her a return call.

## 2024-10-20 ENCOUNTER — Encounter: Payer: Self-pay | Admitting: Physician Assistant

## 2024-10-20 ENCOUNTER — Ambulatory Visit: Payer: Self-pay | Admitting: Physician Assistant

## 2024-10-20 ENCOUNTER — Ambulatory Visit (INDEPENDENT_AMBULATORY_CARE_PROVIDER_SITE_OTHER): Admitting: Physician Assistant

## 2024-10-20 VITALS — BP 111/57 | HR 106 | Ht 63.0 in | Wt 113.0 lb

## 2024-10-20 DIAGNOSIS — N898 Other specified noninflammatory disorders of vagina: Secondary | ICD-10-CM | POA: Insufficient documentation

## 2024-10-20 DIAGNOSIS — R1030 Lower abdominal pain, unspecified: Secondary | ICD-10-CM | POA: Insufficient documentation

## 2024-10-20 DIAGNOSIS — R300 Dysuria: Secondary | ICD-10-CM

## 2024-10-20 DIAGNOSIS — G4700 Insomnia, unspecified: Secondary | ICD-10-CM | POA: Insufficient documentation

## 2024-10-20 DIAGNOSIS — N3010 Interstitial cystitis (chronic) without hematuria: Secondary | ICD-10-CM

## 2024-10-20 LAB — URINE CULTURE

## 2024-10-20 MED ORDER — NITROFURANTOIN MONOHYD MACRO 100 MG PO CAPS: 100.0000 mg | ORAL_CAPSULE | Freq: Two times a day (BID) | ORAL | 0 refills | Status: DC

## 2024-10-20 MED ORDER — NORTRIPTYLINE HCL 75 MG PO CAPS: 75.0000 mg | ORAL_CAPSULE | Freq: Every day | ORAL | 1 refills | Status: AC

## 2024-10-20 MED ORDER — HYDROCODONE-ACETAMINOPHEN 5-325 MG PO TABS: 1.0000 | ORAL_TABLET | Freq: Four times a day (QID) | ORAL | 0 refills | Status: AC | PRN

## 2024-10-20 NOTE — Progress Notes (Signed)
 Acute Office Visit  Subjective:     Patient ID: Debbie Hancock, female    DOB: 11-14-1966, 58 y.o.   MRN: [id number]  Chief Complaint  Patient presents with   Medical Management of Chronic Issues    HPI .SABRADiscussed the use of AI scribe software for clinical note transcription with the patient, who gave verbal consent to proceed.  History of Present Illness Debbie Hancock is a 58 year old female with interstitial cystitis who presents with severe pain.   Pelvic pain and interstitial cystitis symptoms - Severe pelvic pain associated with interstitial cystitis, present since 2004 - Pain is worsening and currently prevents sleep at night - Pain described as 'very, very painful' and causes vomiting - Condition previously required five years of pain management but she has not had to have norco in a while.  - Believes condition is out of remission - Tenderness to touch in the affected area - Frequent urination - Severe low back pain - No pain in the kidney area  Urinary symptoms - No blood in last POC UA. Waiting for culture to return.  - Recent episode of yellowish discharge on toilet paper - Pain with urination  Prior treatments and medication - Previously evaluated by urologist - Prior treatments included catheterization, which was ineffective - Currently taking nortriptyline , which helps with generalized pain and interstitial cystitis symptoms  Renal stone history - No history of kidney stones    ROS See HPI.      Objective:    BP (!) 111/57   Pulse (!) 106   Ht 5' 3 (1.6 m)   Wt 113 lb (51.3 kg)   SpO2 99%   BMI 20.02 kg/m  BP Readings from Last 3 Encounters:  10/20/24 (!) 111/57  10/18/24 110/70  01/12/24 118/73   Wt Readings from Last 3 Encounters:  10/20/24 113 lb (51.3 kg)  10/18/24 113 lb (51.3 kg)  09/04/24 120 lb (54.4 kg)      Physical Exam Constitutional:      Appearance: Normal appearance.  HENT:     Head: Normocephalic.   Cardiovascular:     Rate and Rhythm: Regular rhythm. Tachycardia present.  Pulmonary:     Effort: Pulmonary effort is normal.     Breath sounds: Normal breath sounds.  Abdominal:     General: Bowel sounds are normal. There is no distension.     Palpations: Abdomen is soft. There is no mass.     Tenderness: There is abdominal tenderness. There is no right CVA tenderness, left CVA tenderness, guarding or rebound.  Neurological:     General: No focal deficit present.     Mental Status: She is alert and oriented to person, place, and time.  Psychiatric:        Mood and Affect: Mood normal.          Assessment & Plan:  SABRASABRAShavette was seen today for medical management of chronic issues.  Diagnoses and all orders for this visit:  Interstitial cystitis -     nitrofurantoin , macrocrystal-monohydrate, (MACROBID ) 100 MG capsule; Take 1 capsule (100 mg total) by mouth 2 (two) times daily. -     HYDROcodone -acetaminophen  (NORCO/VICODIN) 5-325 MG tablet; Take 1 tablet by mouth every 6 (six) hours as needed for up to 5 days for moderate pain (pain score 4-6). -     nortriptyline  (PAMELOR ) 75 MG capsule; Take 1 capsule (75 mg total) by mouth at bedtime.  Lower abdominal pain -     nitrofurantoin ,  macrocrystal-monohydrate, (MACROBID ) 100 MG capsule; Take 1 capsule (100 mg total) by mouth 2 (two) times daily. -     HYDROcodone -acetaminophen  (NORCO/VICODIN) 5-325 MG tablet; Take 1 tablet by mouth every 6 (six) hours as needed for up to 5 days for moderate pain (pain score 4-6). -     nortriptyline  (PAMELOR ) 75 MG capsule; Take 1 capsule (75 mg total) by mouth at bedtime.  Dysuria -     nitrofurantoin , macrocrystal-monohydrate, (MACROBID ) 100 MG capsule; Take 1 capsule (100 mg total) by mouth 2 (two) times daily. -     HYDROcodone -acetaminophen  (NORCO/VICODIN) 5-325 MG tablet; Take 1 tablet by mouth every 6 (six) hours as needed for up to 5 days for moderate pain (pain score 4-6). -      nortriptyline  (PAMELOR ) 75 MG capsule; Take 1 capsule (75 mg total) by mouth at bedtime.   Assessment & Plan Interstitial cystitis with flare  Severe flare causing significant pain and sleep disturbance. Previous treatments ineffective with tramadol and pyridium . Differential includes possible yeast infection vs UTI. Pain management is a concern, but she prefers to avoid long-term pain management. - Increased nortriptyline  dosage to 75mcg bid.  - Prescribed Norco for temporary pain relief 1 tablet every 6 hours.  SABRASABRAPDMP reviewed during this encounter. No concerns - Started Macrobid  for potential infection since pain is worsening and cultures are not back.  - Await culture results for further management.   Return if symptoms worsen or fail to improve.  Alexa Blish, PA-C

## 2024-10-20 NOTE — Patient Instructions (Addendum)
 Start macrobid  and use norco as needed every 6 hours for pain control.  Increase nortripyline to 75mg  twice a day.

## 2024-10-20 NOTE — Progress Notes (Signed)
 Negative for yeast, BV, Trichomonas.

## 2024-10-20 NOTE — Progress Notes (Signed)
 Overall labs look good.  Stop taking extra calcium  and make sure taking vitamin D!

## 2024-10-21 LAB — NUSWAB VAGINITIS PLUS (VG+)
Candida albicans, NAA: NEGATIVE
Candida glabrata, NAA: NEGATIVE
Chlamydia trachomatis, NAA: NEGATIVE
Neisseria gonorrhoeae, NAA: NEGATIVE
Trich vag by NAA: NEGATIVE

## 2024-10-23 DIAGNOSIS — F3132 Bipolar disorder, current episode depressed, moderate: Secondary | ICD-10-CM | POA: Diagnosis not present

## 2024-10-23 DIAGNOSIS — F4312 Post-traumatic stress disorder, chronic: Secondary | ICD-10-CM | POA: Diagnosis not present

## 2024-10-23 NOTE — Progress Notes (Signed)
 Negative for yeast, BV, Trichomonas.

## 2024-10-24 NOTE — Telephone Encounter (Signed)
 Patient seen in office visit  10/20/2024 with Vermell Bologna, PA

## 2024-10-25 ENCOUNTER — Telehealth: Payer: Self-pay

## 2024-10-25 NOTE — Telephone Encounter (Signed)
 Copied from CRM [id number]. Topic: General - Other >> Oct 24, 2024  4:45 PM Dedra B wrote: Reason for CRM: Rolin from [facility] needs the referral and prior authorization for CT chest done 08/01/24 faxed to [phone] since the provider was out of network.SABRA

## 2024-11-15 ENCOUNTER — Ambulatory Visit: Admitting: Physician Assistant

## 2024-11-15 ENCOUNTER — Encounter: Payer: Self-pay | Admitting: Physician Assistant

## 2024-11-15 VITALS — BP 118/68 | HR 90 | Wt 122.0 lb

## 2024-11-15 DIAGNOSIS — N3010 Interstitial cystitis (chronic) without hematuria: Secondary | ICD-10-CM | POA: Diagnosis not present

## 2024-11-15 DIAGNOSIS — F17200 Nicotine dependence, unspecified, uncomplicated: Secondary | ICD-10-CM | POA: Insufficient documentation

## 2024-11-15 DIAGNOSIS — R1030 Lower abdominal pain, unspecified: Secondary | ICD-10-CM | POA: Diagnosis not present

## 2024-11-15 LAB — POCT URINALYSIS DIP (CLINITEK)
Bilirubin, UA: NEGATIVE
Blood, UA: NEGATIVE
Glucose, UA: NEGATIVE mg/dL
Ketones, POC UA: NEGATIVE mg/dL
Leukocytes, UA: NEGATIVE
Nitrite, UA: NEGATIVE
POC PROTEIN,UA: NEGATIVE
Spec Grav, UA: 1.025 (ref 1.010–1.025)
Urobilinogen, UA: 0.2 U/dL
pH, UA: 7 (ref 5.0–8.0)

## 2024-11-15 MED ORDER — HYDROCODONE-ACETAMINOPHEN 10-325 MG PO TABS: 1.0000 | ORAL_TABLET | Freq: Two times a day (BID) | ORAL | 0 refills | Status: AC | PRN

## 2024-11-15 NOTE — Progress Notes (Signed)
 Established Patient Office Visit  Subjective   Patient ID: Debbie Hancock, female    DOB: 05/09/1966  Age: 58 y.o. MRN: [id number]  Chief Complaint  Patient presents with   Medical Management of Chronic Issues    HPI .SABRADiscussed the use of AI scribe software for clinical note transcription with the patient, who gave verbal consent to proceed.  History of Present Illness Debbie Hancock is a 58 year old female with interstitial cystitis who presents with a severe flare-up of bladder pain.  Bladder pain and interstitial cystitis symptoms - Severe constant bladder pain, rated as 'past a ten' on a scale of one to ten - Pain worsens with activities such as inserting a swab for culture testing - History of interstitial cystitis since 2004 - Previous bladder infusions provided no relief - History of bladder ulcers with red patches of inflammation on the bladder wall - She has used Norco before at 10mg  dose twice a day and helps with pain - She had not had a flare in over 8 months until this episode   Urinary symptoms - Frequent urination - Difficulty emptying bladder completely due to pain - On mrybetriq and notripyline  Pain management and medication use - Currently takes nortriptyline  twice daily; increased dosage has not effectively managed symptoms - Uses hydrocodone  for pain management; finds these to be the only effective treatments - Ran out of pain medication yesterday and is concerned about managing future flare-ups without adequate pain relief      ROS See HPI.    Objective:     BP 118/68 (Cuff Size: Normal)   Pulse 90   Wt 122 lb (55.3 kg)   SpO2 100%   BMI 21.61 kg/m  BP Readings from Last 3 Encounters:  11/15/24 118/68  10/20/24 (!) 111/57  10/18/24 110/70   Wt Readings from Last 3 Encounters:  11/15/24 122 lb (55.3 kg)  10/20/24 113 lb (51.3 kg)  10/18/24 113 lb (51.3 kg)   .Debbie Hancock Results for orders placed or performed in visit on 11/15/24  POCT  URINALYSIS DIP (CLINITEK)   Collection Time: 11/15/24  1:32 PM  Result Value Ref Range   Color, UA yellow yellow   Clarity, UA cloudy (A) clear   Glucose, UA negative negative mg/dL   Bilirubin, UA negative negative   Ketones, POC UA negative negative mg/dL   Spec Grav, UA 8.974 8.989 - 1.025   Blood, UA negative negative   pH, UA 7.0 5.0 - 8.0   POC PROTEIN,UA negative negative, trace   Urobilinogen, UA 0.2 0.2 or 1.0 E.U./dL   Nitrite, UA Negative Negative   Leukocytes, UA Negative Negative      Physical Exam Constitutional:      Appearance: Normal appearance.  HENT:     Head: Normocephalic.  Cardiovascular:     Rate and Rhythm: Normal rate and regular rhythm.  Pulmonary:     Effort: Pulmonary effort is normal.     Breath sounds: Normal breath sounds.  Abdominal:     General: There is no distension.     Palpations: There is no mass.     Tenderness: There is abdominal tenderness. There is no right CVA tenderness, left CVA tenderness, guarding or rebound.     Hernia: No hernia is present.     Comments: Lower abdominal and suprapubic tenderness to palpation.   Neurological:     Mental Status: She is alert.  Psychiatric:        Mood and Affect: Mood  normal.      The 10-year ASCVD risk score (Arnett DK, et al., 2019) is: 6.7%    Assessment & Plan:  SABRASABRALarrisa was seen today for medical management of chronic issues.  Diagnoses and all orders for this visit:  Interstitial cystitis -     POCT URINALYSIS DIP (CLINITEK) -     Urine Culture -     NuSwab Vaginitis Plus (VG+) -     HYDROcodone -acetaminophen  (NORCO) 10-325 MG tablet; Take 1 tablet by mouth every 12 (twelve) hours as needed. -     Ambulatory referral to Urology  Lower abdominal pain -     POCT URINALYSIS DIP (CLINITEK) -     Urine Culture -     NuSwab Vaginitis Plus (VG+) -     HYDROcodone -acetaminophen  (NORCO) 10-325 MG tablet; Take 1 tablet by mouth every 12 (twelve) hours as needed. -     Ambulatory  referral to Urology   Assessment & Plan Interstitial cystitis without hematuria with chronic lower abdominal pain Chronic interstitial cystitis with severe flare-up, pain rated 10/10. No hematuria, leukocytes, or nitrates in urine. Will culture urine. No signs of bacteria to treat. Will wait for vaginal culture. Current flare-up linked to inflammation, not infection. Discussed dietary triggers and urology referral. Explained risks of chronic inflammation in bladder and potentially leading to cancerous lesion.  - Prescribed hydrocodone  10/325 mg, one tablet bid as needed for pain. Debbie Hancock.PDMP reviewed during this encounter. - Referred to a urologist specializing in interstitial cystitis for further evaluation and management. - Advised monitoring for dietary triggers that may exacerbate symptoms. - Discussed potential risks of chronic inflammation leading to cancerous lesions and importance of monitoring for ulcers. - Continue myrbetriq  and nortripyline and gabapentin .  - Encouraged smoking cessation and no alcohol.      Return if symptoms worsen or fail to improve.    Harvis Mabus, PA-C

## 2024-11-15 NOTE — Patient Instructions (Addendum)
 Will refer to urologist that specializes in interstitial cystitis.  Norco twice a day for pain control.   Eating Plan for Interstitial Cystitis Interstitial cystitis (IC) is a long-term (chronic) condition that can cause pain and pressure near your bladder. It can also make you have to pee urgently and often. Symptoms may come and go. Certain foods may trigger your symptoms. Learning what foods bother you can help you come up with an eating plan to manage IC. What are tips for following this plan? You may want to work with an expert in healthy eating called a dietitian. They can help you make an eating plan by doing an elimination diet. This diet involves: Making a list of foods that you think trigger your symptoms. You may also want to include foods that often cause symptoms in other people with IC. It may take a few months to find out which foods bother you. Taking those foods out of your diet for about a month. After that month, you can try to have the foods again one at a time to see which ones cause symptoms. Reading food labels Once you know which foods trigger your symptoms, you can avoid them. But it's still a good idea to read food labels. Some foods that cause your symptoms may be ingredients in other foods. These foods may include: Soy. Worcestershire sauce. Vinegar. Alcohol. Artificial sweeteners. Monosodium glutamate. Other triggers may include: Chili peppers. Tomato products. Citrus fruits, flavors, or juices. Shopping Shopping can be hard if many foods trigger your IC. Bring a list of the foods you can't eat with you when you go to the store. You can get an app for your phone that lets you know which foods are safer and which ones you may want to avoid. You can find the app at the [employer] website: ic-network.com Meal planning Plan your meals based on the results of your elimination diet. If you haven't done the diet yet, plan meals based on IC food lists. These lists may  be given to you by your health care provider or dietitian. The lists tell you which foods are least and most likely to cause symptoms. Avoid certain types of food when you go out to eat. These may include: Pizza. Indian food. Mexican food. Thai food. General information Do not eat large portions. Drink lots of fluids with your meals. Do not eat foods that are high in sugar, salt, or saturated fat. Choose whole fruits instead of juice. Eat a colorful variety of vegetables. Find ways to manage stress. Get enough exercise. What foods should I eat? For people with IC, the best diet is a balanced one. This means it includes things from all the food groups. Even if you have to avoid certain foods, there are still lots of healthy choices in each group. Below are some foods that may be safest for you to eat: Fruits Bananas. Blueberries and blueberry juice. Melons. Pears. Apples. Dates. Prunes. Raisins. Apricots. Vegetables Asparagus. Avocado. Celery. Beets. Bell peppers. Black olives. Broccoli. Brussels sprouts. Cabbage. Carrots. Cauliflower. Cucumber. Eggplant. Green beans. Potatoes. Radishes. Spinach. Squash. Turnips. Zucchini. Mushrooms. Peas. Grains Oats. Rice. Bran. Oatmeal. Whole wheat bread. Meats and other proteins Beef. Fish and other seafood. Eggs. Nuts. Peanut butter. Pork. Poultry. Lamb. Garbanzo beans. Pinto beans. Dairy Whole or low-fat milk. American, mozzarella, mild cheddar, feta, ricotta, and cream cheeses. The items listed above may not be all the foods and drinks you can have. Talk to a dietitian to learn more. What  foods should I avoid? You should avoid any foods that cause symptoms. It's also a good idea to avoid foods that often cause symptoms in many people with IC. These foods include: Fruits Citrus fruits, such as lemons, limes, oranges, and grapefruit. Cranberries. Strawberries. Pineapple. Kiwi. Vegetables Chili peppers. Onions. Sauerkraut. Tomato and tomato  products. Dene. Grains You don't need to avoid any type of grain unless it causes symptoms. Meats and other proteins Precooked or cured meats, such as sausages or meat loaves. Soy products. Dairy Chocolate ice cream. Processed cheese. Yogurt. Drinks Alcohol. Chocolate drinks. Coffee. Cranberry juice. Fizzy drinks. Black, green, or herbal tea. Tomato juice. Sports drinks. The items listed above may not be all the foods and drinks you should avoid. Talk to a dietitian to learn more. This information is not intended to replace advice given to you by your health care provider. Make sure you discuss any questions you have with your health care provider. Document Revised: 03/11/2023 Document Reviewed: 03/11/2023 Elsevier Patient Education  2024 [employer].

## 2024-11-17 ENCOUNTER — Ambulatory Visit: Payer: Self-pay | Admitting: Physician Assistant

## 2024-11-17 LAB — URINE CULTURE

## 2024-11-17 NOTE — Progress Notes (Signed)
No bacteria in urine

## 2024-11-18 LAB — NUSWAB VAGINITIS PLUS (VG+)
Candida albicans, NAA: NEGATIVE
Candida glabrata, NAA: NEGATIVE
Trich vag by NAA: POSITIVE — AB

## 2024-11-20 MED ORDER — METRONIDAZOLE 500 MG PO TABS: 500.0000 mg | ORAL_TABLET | Freq: Two times a day (BID) | ORAL | 0 refills | Status: DC

## 2024-11-20 NOTE — Progress Notes (Signed)
 Swab positive for trichomonas. Will send over metronidazole  to treat.

## 2024-12-12 MED ORDER — TIRZEPATIDE 7.5 MG/0.5ML ~~LOC~~ SOAJ: 7.5000 mg | SUBCUTANEOUS | 0 refills | Status: DC

## 2024-12-27 ENCOUNTER — Ambulatory Visit: Admitting: Physician Assistant

## 2024-12-27 DIAGNOSIS — N3010 Interstitial cystitis (chronic) without hematuria: Secondary | ICD-10-CM

## 2024-12-27 DIAGNOSIS — R1030 Lower abdominal pain, unspecified: Secondary | ICD-10-CM

## 2024-12-29 ENCOUNTER — Ambulatory Visit: Admitting: Physician Assistant

## 2025-01-02 ENCOUNTER — Telehealth: Admitting: Physician Assistant

## 2025-01-02 ENCOUNTER — Encounter: Payer: Self-pay | Admitting: Physician Assistant

## 2025-01-02 VITALS — Wt 120.0 lb

## 2025-01-02 DIAGNOSIS — N898 Other specified noninflammatory disorders of vagina: Secondary | ICD-10-CM | POA: Diagnosis not present

## 2025-01-02 DIAGNOSIS — N3010 Interstitial cystitis (chronic) without hematuria: Secondary | ICD-10-CM

## 2025-01-02 DIAGNOSIS — Z8619 Personal history of other infectious and parasitic diseases: Secondary | ICD-10-CM | POA: Diagnosis not present

## 2025-01-02 MED ORDER — HYDROCODONE-ACETAMINOPHEN 10-325 MG PO TABS: 1.0000 | ORAL_TABLET | Freq: Four times a day (QID) | ORAL | 0 refills | Status: AC | PRN

## 2025-01-02 NOTE — Progress Notes (Signed)
..  Virtual Visit via Video Note  I connected with Debbie Hancock on 01/02/25 at  9:10 AM EST by a video enabled telemedicine application and verified that I am speaking with the correct person using two identifiers.  Location: Patient: home Provider: clinic  .SABRAParticipating in visit:  Patient: Debbie Hancock Provider: Vermell Bologna PA-C Provider in training: Annitta Raw PA-S    I discussed the limitations of evaluation and management by telemedicine and the availability of in person appointments. The patient expressed understanding and agreed to proceed.  History of Present Illness: .Discussed the use of AI scribe software for clinical note transcription with the patient, who gave verbal consent to proceed.  History of Present Illness Debbie Hancock is a 59 year old female who presents with persistent vaginal discharge.  Vaginal discharge - Persistent reddish-brown vaginal discharge present in underwear and on pads for last few weeks - Discharge has worsened over time - No associated pruritus or lower abdominal pain - Completed a course of metronidazole  without improvement for trichomonas about 2 weeks ago - Trichomonas test negative two months ago - No sexual activity for seven years - Last Pap smear in September 2024 was negative for abnormal cells or any STDs - She denies any abnormal pain currently  Interstitial cystitis - History of interstitial cystitis with significant pain during previous flare-ups - Past management included hydrocodone  for pain control - No current flare-up - Concern regarding future episodes and difficulty managing without medication - Not going to be able to go to [facility] because of issues with transportation - She would like referral closer to [facility]  Barriers to care - Transportation issues complicate ability to attend medical appointments, including psychiatric and urology visits     Observations/Objective: No acute distress Normal  breathing   Assessment and Plan: .[name] was seen today for vaginal discharge, bladder questions and mounjaro .  Diagnoses and all orders for this visit:  Vaginal discharge  History of trichomoniasis  Interstitial cystitis -     Ambulatory referral to Urology -     HYDROcodone -acetaminophen  (NORCO) 10-325 MG tablet; Take 1 tablet by mouth every 6 (six) hours as needed for severe pain (pain score 7-10). For Interstitial cystitis flare   Assessment & Plan Woman's Wellness Visit Discussed telehealth limitations and insurance changes necessitating in-person visits. - Schedule in-person appointment for pelvic exam and Pap smear. - Ensure continuity of psychiatric care by finding a local psychiatrist.  Abnormal vaginal discharge Persistent reddish-brown discharge. Differential includes cervical pathology or uterine abnormalities. - Scheduled pelvic exam and Pap smear. - Consider abdominal ultrasound if Pap smear is normal. - Repeat trichomoniasis test.  History of trichomoniasis Current discharge not typical of trichomoniasis. Testing 2 months ago negative but 1 month ago positive. Completed metronidazole . - Repeat trichomoniasis test during pelvic exam.  Interstitial cystitis Previous severe flare-ups treated with hydrocodone .  - Prescribed hydrocodone  for flare-up management. - Referred to urologist at [facility].      Follow Up Instructions:    I discussed the assessment and treatment plan with the patient. The patient was provided an opportunity to ask questions and all were answered. The patient agreed with the plan and demonstrated an understanding of the instructions.   The patient was advised to call back or seek an in-person evaluation if the symptoms worsen or if the condition fails to improve as anticipated.  Geoffry Bannister, PA-C

## 2025-01-06 ENCOUNTER — Other Ambulatory Visit: Payer: Self-pay | Admitting: Physician Assistant

## 2025-01-09 ENCOUNTER — Ambulatory Visit: Admitting: Physician Assistant

## 2025-01-12 ENCOUNTER — Ambulatory Visit: Payer: Self-pay | Admitting: Physician Assistant

## 2025-01-12 ENCOUNTER — Other Ambulatory Visit (HOSPITAL_COMMUNITY)
Admission: RE | Admit: 2025-01-12 | Discharge: 2025-01-12 | Disposition: A | Source: Ambulatory Visit | Attending: Physician Assistant | Admitting: Physician Assistant

## 2025-01-12 ENCOUNTER — Ambulatory Visit: Admitting: Physician Assistant

## 2025-01-12 ENCOUNTER — Encounter: Payer: Self-pay | Admitting: Physician Assistant

## 2025-01-12 VITALS — BP 121/66 | HR 66 | Ht 63.0 in | Wt 139.0 lb

## 2025-01-12 DIAGNOSIS — Z124 Encounter for screening for malignant neoplasm of cervix: Secondary | ICD-10-CM | POA: Diagnosis present

## 2025-01-12 DIAGNOSIS — N898 Other specified noninflammatory disorders of vagina: Secondary | ICD-10-CM | POA: Diagnosis not present

## 2025-01-12 DIAGNOSIS — D259 Leiomyoma of uterus, unspecified: Secondary | ICD-10-CM

## 2025-01-12 DIAGNOSIS — N3010 Interstitial cystitis (chronic) without hematuria: Secondary | ICD-10-CM

## 2025-01-12 DIAGNOSIS — N840 Polyp of corpus uteri: Secondary | ICD-10-CM

## 2025-01-12 LAB — POCT URINALYSIS DIP (CLINITEK)
Bilirubin, UA: NEGATIVE
Blood, UA: NEGATIVE
Glucose, UA: NEGATIVE mg/dL
Ketones, POC UA: NEGATIVE mg/dL
Leukocytes, UA: NEGATIVE
Nitrite, UA: NEGATIVE
POC PROTEIN,UA: NEGATIVE
Spec Grav, UA: 1.02
Urobilinogen, UA: 0.2 U/dL
pH, UA: 7.5

## 2025-01-12 NOTE — Progress Notes (Signed)
 "  Established Patient Office Visit  Subjective   Patient ID: Debbie Hancock, female    DOB: 1966-10-09  Age: 59 y.o. MRN: [id number]  Chief Complaint  Patient presents with   Gynecologic Exam    HPI .Discussed the use of AI scribe software for clinical note transcription with the patient, who gave verbal consent to proceed.  History of Present Illness Debbie Hancock is a 59 year old female who presents for a routine Pap smear and evaluation of vaginal discharge.  Vaginal discharge - Brownish-red discharge present for a couple of days then goes away but then comes back - Uncertain if discharge is originating from the bladder due to occasional leakage - No associated odor - No sexual activity for the past seven years - Intermittent abdominal pressure and pain due to IC flares, no recent flare.   Pap smear discomfort - Pap smear performed during visit - Pap smears are typically painful - Discomfort experienced during current Pap smear - Last pap 2024 with no abnormal findings     ROS See HPI.    Objective:     BP 121/66   Pulse 66   Ht 5' 3 (1.6 m)   Wt 139 lb (63 kg)   SpO2 99%   BMI 24.62 kg/m  BP Readings from Last 3 Encounters:  01/12/25 121/66  11/15/24 118/68  10/20/24 (!) 111/57   Wt Readings from Last 3 Encounters:  01/12/25 139 lb (63 kg)  01/02/25 120 lb (54.4 kg)  11/15/24 122 lb (55.3 kg)      Physical Exam HENT:     Head: Normocephalic.  Cardiovascular:     Rate and Rhythm: Normal rate.  Pulmonary:     Effort: Pulmonary effort is normal.  Abdominal:     General: There is no distension.     Palpations: There is no mass.     Tenderness: There is abdominal tenderness. There is no right CVA tenderness, left CVA tenderness, guarding or rebound.     Hernia: No hernia is present.     Comments: Tenderness to palpation over lower abdomen.   Genitourinary:    Comments: No cervical masses or polyps. No abnormal discharge or bleeding.   Neurological:     General: No focal deficit present.     Mental Status: She is alert and oriented to person, place, and time.  Psychiatric:        Mood and Affect: Mood normal.      Results for orders placed or performed in visit on 01/12/25  POCT URINALYSIS DIP (CLINITEK)  Result Value Ref Range   Color, UA yellow yellow   Clarity, UA clear clear   Glucose, UA negative negative mg/dL   Bilirubin, UA negative negative   Ketones, POC UA negative negative mg/dL   Spec Grav, UA 8.979 8.989 - 1.025   Blood, UA negative negative   pH, UA 7.5 5.0 - 8.0   POC PROTEIN,UA negative negative, trace   Urobilinogen, UA 0.2 0.2 or 1.0 E.U./dL   Nitrite, UA Negative Negative   Leukocytes, UA Negative Negative     Assessment & Plan:  .Reverie was seen today for gynecologic exam.  Diagnoses and all orders for this visit:  Pap smear for cervical cancer screening -     Cytology - PAP -     WET PREP FOR TRICH, YEAST, CLUE -     POCT URINALYSIS DIP (CLINITEK)  Vaginal discharge -     WET PREP FOR TRICH, YEAST, CLUE -  POCT URINALYSIS DIP (CLINITEK) -     Urine Culture -     US  Pelvic Complete With Transvaginal; Future  Interstitial cystitis -     Urine Culture   Assessment & Plan Woman's Wellness Visit Routine wellness visit with Pap smear performed. Pap smear results expected in five days. - Performed Pap smear.  Vaginal discharge Reports brownish-red discharge, possibly from bladder or vaginally. No discharge seen on exam today.  - Ordered pelvic ultrasound to evaluate ovaries and rule out fibroids. - Repeated urine test to ensure clarity, no concerns in UA but added culture.  - Wet prep to look for any causes of discharge.   Interstitial cystitis Reports urinary leakage and brownish-red discharge, possibly related to interstitial cystitis. - Repeated urine test to ensure clarity with reflex urine culture.  - referral placed for urology consult     Vermell Bologna,  PA-C  "

## 2025-01-12 NOTE — Progress Notes (Signed)
 Normal UA

## 2025-01-13 LAB — WET PREP FOR TRICH, YEAST, CLUE
Clue Cell Exam: NEGATIVE
Trichomonas Exam: NEGATIVE
Yeast Exam: NEGATIVE

## 2025-01-15 NOTE — Progress Notes (Signed)
 Mc,   Negative for Trichomonas, yeast, bacterial vaginosis. Other results still pending.

## 2025-01-16 LAB — CYTOLOGY - PAP
Chlamydia: NEGATIVE
Comment: NEGATIVE
Comment: NEGATIVE
Comment: NORMAL
Diagnosis: NEGATIVE
Neisseria Gonorrhea: NEGATIVE
Trichomonas: NEGATIVE

## 2025-01-16 LAB — URINE CULTURE

## 2025-01-17 ENCOUNTER — Other Ambulatory Visit

## 2025-01-17 DIAGNOSIS — D259 Leiomyoma of uterus, unspecified: Secondary | ICD-10-CM | POA: Insufficient documentation

## 2025-01-17 DIAGNOSIS — N840 Polyp of corpus uteri: Secondary | ICD-10-CM | POA: Insufficient documentation

## 2025-01-17 DIAGNOSIS — N898 Other specified noninflammatory disorders of vagina: Secondary | ICD-10-CM

## 2025-01-17 NOTE — Progress Notes (Signed)
 Debbie Hancock,   Pelvic ultrasound shows endometrial polyp and small uterine fibroid. I placed referral next door to GYN for follow up and discussion of results. If continues to cause discharge then could consider removal.

## 2025-01-18 ENCOUNTER — Other Ambulatory Visit: Payer: Self-pay | Admitting: Physician Assistant

## 2025-01-18 DIAGNOSIS — E781 Pure hyperglyceridemia: Secondary | ICD-10-CM

## 2025-03-15 ENCOUNTER — Ambulatory Visit

## 2025-04-17 ENCOUNTER — Ambulatory Visit
